# Patient Record
Sex: Female | Born: 1975 | Race: Black or African American | Hispanic: No | Marital: Single | Smoking: Never smoker
Health system: Southern US, Community
[De-identification: ages and names within clinical notes are randomized; demographics above are authoritative.]

## PROBLEM LIST (undated history)

## (undated) DIAGNOSIS — F79 Unspecified intellectual disabilities: Secondary | ICD-10-CM

## (undated) DIAGNOSIS — F209 Schizophrenia, unspecified: Secondary | ICD-10-CM

## (undated) DIAGNOSIS — H9190 Unspecified hearing loss, unspecified ear: Secondary | ICD-10-CM

## (undated) DIAGNOSIS — G809 Cerebral palsy, unspecified: Secondary | ICD-10-CM

## (undated) DIAGNOSIS — E119 Type 2 diabetes mellitus without complications: Secondary | ICD-10-CM

## (undated) DIAGNOSIS — E785 Hyperlipidemia, unspecified: Secondary | ICD-10-CM

## (undated) HISTORY — DX: Schizophrenia, unspecified: F20.9

## (undated) HISTORY — DX: Unspecified intellectual disabilities: F79

## (undated) HISTORY — DX: Unspecified hearing loss, unspecified ear: H91.90

## (undated) HISTORY — DX: Cerebral palsy, unspecified: G80.9

## (undated) HISTORY — DX: Hyperlipidemia, unspecified: E78.5

## (undated) HISTORY — DX: Type 2 diabetes mellitus without complications: E11.9

---

## 2020-01-06 ENCOUNTER — Ambulatory Visit: Payer: Self-pay | Admitting: Family Medicine

## 2020-02-01 ENCOUNTER — Encounter: Payer: Self-pay | Admitting: Family Medicine

## 2020-02-01 ENCOUNTER — Ambulatory Visit (INDEPENDENT_AMBULATORY_CARE_PROVIDER_SITE_OTHER): Payer: Medicaid Other | Admitting: Family Medicine

## 2020-02-01 ENCOUNTER — Other Ambulatory Visit: Payer: Self-pay

## 2020-02-01 VITALS — BP 105/60 | HR 100 | Ht 64.0 in | Wt 157.6 lb

## 2020-02-01 DIAGNOSIS — Z7689 Persons encountering health services in other specified circumstances: Secondary | ICD-10-CM

## 2020-02-01 DIAGNOSIS — Z3009 Encounter for other general counseling and advice on contraception: Secondary | ICD-10-CM | POA: Diagnosis not present

## 2020-02-01 DIAGNOSIS — E1169 Type 2 diabetes mellitus with other specified complication: Secondary | ICD-10-CM

## 2020-02-01 DIAGNOSIS — F209 Schizophrenia, unspecified: Secondary | ICD-10-CM | POA: Diagnosis not present

## 2020-02-01 DIAGNOSIS — H913 Deaf nonspeaking, not elsewhere classified: Secondary | ICD-10-CM | POA: Diagnosis not present

## 2020-02-01 DIAGNOSIS — F79 Unspecified intellectual disabilities: Secondary | ICD-10-CM

## 2020-02-01 DIAGNOSIS — E785 Hyperlipidemia, unspecified: Secondary | ICD-10-CM

## 2020-02-01 DIAGNOSIS — G809 Cerebral palsy, unspecified: Secondary | ICD-10-CM | POA: Diagnosis not present

## 2020-02-01 DIAGNOSIS — E119 Type 2 diabetes mellitus without complications: Secondary | ICD-10-CM

## 2020-02-01 MED ORDER — LISINOPRIL 5 MG PO TABS: 5.0000 mg | ORAL_TABLET | Freq: Every day | ORAL | 0 refills | Status: DC

## 2020-02-01 MED ORDER — EMPAGLIFLOZIN 25 MG PO TABS: 25.0000 mg | ORAL_TABLET | Freq: Every day | ORAL | 0 refills | Status: DC

## 2020-02-01 MED ORDER — GLIPIZIDE 10 MG PO TABS: 10.0000 mg | ORAL_TABLET | Freq: Every day | ORAL | 0 refills | Status: DC

## 2020-02-01 MED ORDER — PRAVASTATIN SODIUM 40 MG PO TABS: 40.0000 mg | ORAL_TABLET | Freq: Every day | ORAL | 0 refills | Status: DC

## 2020-02-01 MED ORDER — SITAGLIPTIN PHOSPHATE 100 MG PO TABS: 100.0000 mg | ORAL_TABLET | Freq: Every day | ORAL | 0 refills | Status: DC

## 2020-02-01 NOTE — Progress Notes (Signed)
SUBJECTIVE:   CHIEF COMPLAINT / HPI:   Establish Care Patient presents today to establish care Caregiver, who is her sister Harle Stanford, present with her today Previously was in [place]  Dr. Chales Salmon was previous doctor, no records in care everywhere  Previously she was living in a [facility] for 9 years, then sister moved back to [place], case worker contacted her sister and said that the place was under investigation for neglect   Sister states that she is concerned that she has been on Depo shot and hasn't had in 3 months, but she isn't sure what her normal periods are like Patient reports last period 6 months ago  Sister is not sure why she is deaf or has intellectual disability  Sister also concerned that she is not on the proper medications  Patient can read and write a little bit, but not at high level She is able to sign her own consent  Unsure when last Pap smear and unsure if they were ever abnormal  Diabetes She has been on Jardiance, Januvia, Glipizide, Lisinopril, Pravastatin Sister checks her sugars in the morning and they are usually in 70s-90s range  Hyperlipidemia  Has been on pravastatin Sister does know that she was diagnosed with high cholesterol  Schizophrenia Sister reports history of diagnosis Currently on Abilify Has been well-controlled to sister's knowledge  Cerebral Palsy Patient walks with scissoring gait Hold right arm to her chest  Sister functions as sign language interpretor throughout visit at patient request  PERTINENT  PMH / PSH: Congenital deaf mutism, cerebral palsy, intellectual disability, diabetes, hyperlipidemia, schizophrenia  OBJECTIVE:   BP 105/60   Pulse 100   Ht 5\' 4"  (1.626 m)   Wt 157 lb 9.6 oz (71.5 kg)   SpO2 98%   BMI 27.05 kg/m    Physical Exam:  General: 44 y.o. female in NAD Cardio: RRR no m/r/g Lungs: CTAB, no wheezing, no rhonchi, no crackles, no IWOB on RA Skin: warm and  dry Extremities: No edema, left arm contracted and held at chest, muscular tone decreased and left upper and lower extremity, scissoring gait Neuro: smiles, follows commands, uses sign language to communicate   ASSESSMENT/PLAN:   Establishing care with new doctor, encounter for New patient packet reviewed.  Reviewed patient's past medical, surgical, family, social history with patient and her sister.  Updated in the computer.  Also updated medications and problem list.  Records release signed to obtain records from Dr. 45.  Once these are obtained, will have patient return in 1 month and at that time can perform labs and work on health maintenance.  Diabetes mellitus without complication (HCC) Currently on Januvia 100 mg, glipizide 10 mg, Jardiance 25 mg.  Refills supplied for 1 month Fridays.  Will request records, then at next visit can obtain A1c and labs.  Sister is helpful to reduce pill burden, would remove glipizide first.  She is also on statin and lisinopril.  Given that her blood pressures are well controlled and on lisinopril 5, she is most likely on this for renal protection.  Hyperlipidemia associated with type 2 diabetes mellitus (HCC) Currently on pravastatin 40 mg daily.  Obtaining records.  Refill supplied for 1 month.  We will see if lipid panel needs ordered at the next visit.  Cerebral palsy [facility]) Sister reports that he is to attend therapy when she lived with their mother, prior to her mother passing and patient moving to most recent living facility.  She  would like for her to get back to therapy as she feels that she has made some declines in her physical abilities.  Referral placed for physical therapy and Occupational Therapy.  Deaf-mutism, congenital Communicates with sign language.  Prefers sister as Equities trader.  Intellectual disability Can follow commands, read and write at a low level, sign her name, answers questions appropriately with sign language  interpreter.  Schizophrenia (HCC) Currently on Abilify 5 mg daily.  Seems to be well controlled.  Discussed with sister that she does not need refills at this time.  After obtaining records, could consider referral to psychiatry.  Encounter for counseling regarding contraception Patient had previously been on Depo shot.  She has not had a period in 6 months per her report.  It is unclear the last time she received a Depo shot.  Discussed with sister that since patient is not sexually active and we are not sure what her previous periods were like, it may be best to wait and see if she has a period, especially since she is approaching age of possible menopause.  Patient does not seem to have a preference.  We will hold off and see how periods occur.     Unknown Jim, DO [facility]

## 2020-02-01 NOTE — Patient Instructions (Signed)
Thank you for coming to see me today. It was a pleasure. Today we talked about:   We will try to get your old records.  We can get labs at your next visit and do a pap smear.  I have sent refills to the pharmacy.   I have placed a referral to Occupational and Physical Therapy.  If you do not hear from them in the next 2 weeks, please give [place] a call.   Please follow-up with me in 1 month.  If you have any questions or concerns, please do not hesitate to call the office at [phone].  Best,   Luis Abed, DO

## 2020-02-02 DIAGNOSIS — F79 Unspecified intellectual disabilities: Secondary | ICD-10-CM | POA: Insufficient documentation

## 2020-02-02 DIAGNOSIS — Z3009 Encounter for other general counseling and advice on contraception: Secondary | ICD-10-CM | POA: Insufficient documentation

## 2020-02-02 DIAGNOSIS — H913 Deaf nonspeaking, not elsewhere classified: Secondary | ICD-10-CM | POA: Insufficient documentation

## 2020-02-02 NOTE — Assessment & Plan Note (Signed)
New patient packet reviewed.  Reviewed patient's past medical, surgical, family, social history with patient and her sister.  Updated in the computer.  Also updated medications and problem list.  Records release signed to obtain records from Dr. Radonna Ricker.  Once these are obtained, will have patient return in 1 month and at that time can perform labs and work on health maintenance.

## 2020-02-02 NOTE — Assessment & Plan Note (Addendum)
Currently on Januvia 100 mg, glipizide 10 mg, Jardiance 25 mg.  Refills supplied for 1 month Fridays.  Will request records, then at next visit can obtain A1c and labs.  Sister is helpful to reduce pill burden, would remove glipizide first.  She is also on statin and lisinopril.  Given that her blood pressures are well controlled and on lisinopril 5, she is most likely on this for renal protection.

## 2020-02-02 NOTE — Assessment & Plan Note (Signed)
Patient had previously been on Depo shot.  She has not had a period in 6 months per her report.  It is unclear the last time she received a Depo shot.  Discussed with sister that since patient is not sexually active and we are not sure what her previous periods were like, it may be best to wait and see if she has a period, especially since she is approaching age of possible menopause.  Patient does not seem to have a preference.  We will hold off and see how periods occur.

## 2020-02-02 NOTE — Assessment & Plan Note (Signed)
Can follow commands, read and write at a low level, sign her name, answers questions appropriately with sign language interpreter.

## 2020-02-02 NOTE — Assessment & Plan Note (Signed)
Sister reports that he is to attend therapy when she lived with their mother, prior to her mother passing and patient moving to most recent living facility.  She would like for her to get back to therapy as she feels that she has made some declines in her physical abilities.  Referral placed for physical therapy and Occupational Therapy.

## 2020-02-02 NOTE — Assessment & Plan Note (Addendum)
Currently on pravastatin 40 mg daily.  Obtaining records.  Refill supplied for 1 month.  We will see if lipid panel needs ordered at the next visit.

## 2020-02-02 NOTE — Assessment & Plan Note (Signed)
Communicates with sign language.  Prefers sister as Equities trader.

## 2020-02-02 NOTE — Assessment & Plan Note (Signed)
Currently on Abilify 5 mg daily.  Seems to be well controlled.  Discussed with sister that she does not need refills at this time.  After obtaining records, could consider referral to psychiatry.

## 2020-03-06 ENCOUNTER — Other Ambulatory Visit: Payer: Self-pay

## 2020-03-06 ENCOUNTER — Encounter: Payer: Self-pay | Admitting: Family Medicine

## 2020-03-06 ENCOUNTER — Ambulatory Visit (INDEPENDENT_AMBULATORY_CARE_PROVIDER_SITE_OTHER): Payer: Medicaid Other | Admitting: Family Medicine

## 2020-03-06 VITALS — BP 132/62 | Ht 64.0 in | Wt 153.0 lb

## 2020-03-06 DIAGNOSIS — E785 Hyperlipidemia, unspecified: Secondary | ICD-10-CM

## 2020-03-06 DIAGNOSIS — E119 Type 2 diabetes mellitus without complications: Secondary | ICD-10-CM

## 2020-03-06 DIAGNOSIS — E1169 Type 2 diabetes mellitus with other specified complication: Secondary | ICD-10-CM | POA: Diagnosis not present

## 2020-03-06 DIAGNOSIS — Z23 Encounter for immunization: Secondary | ICD-10-CM

## 2020-03-06 DIAGNOSIS — Z1159 Encounter for screening for other viral diseases: Secondary | ICD-10-CM

## 2020-03-06 DIAGNOSIS — Z114 Encounter for screening for human immunodeficiency virus [HIV]: Secondary | ICD-10-CM

## 2020-03-06 LAB — POCT GLYCOSYLATED HEMOGLOBIN (HGB A1C): Hemoglobin A1C: 5.7 % — AB (ref 4.0–5.6)

## 2020-03-06 NOTE — Assessment & Plan Note (Addendum)
A1c today very well controlled at 5.7.  Discussed with sister who reports good CBGs at home.  Will discontinue glipizide 10 mg daily and continue to check CBGs.  If continues to be well controlled, would discontinue Januvia before discontinuing Jardiance.  Referral placed for her ophthalmology for dilated eye exam.  She is on lisinopril and pravastatin as well, which we will continue.  BMP obtained today.  Follow-up in 3 months for repeat A1c.

## 2020-03-06 NOTE — Patient Instructions (Signed)
Thank you for coming to see me today. It was a pleasure. Today we talked about:   Stop taking glipizide.    We will get some labs today.  If they are abnormal or we need to do something about them, I will call you.  If they are normal, I will send you a message on MyChart (if it is active) or a letter in the mail.  If you don't hear from [place] in 2 weeks, please call the office at the number below.  Please follow-up with me in 1-2 months.  If you have any questions or concerns, please do not hesitate to call the office at [phone].  Best,   Luis Abed, DO

## 2020-03-06 NOTE — Progress Notes (Signed)
° ° °  SUBJECTIVE:   CHIEF COMPLAINT / HPI:   T2DM Current regimen: Januvia 100 mg daily, glipizide 10 mg daily, Jardiance 25 mg daily Also on lisinopril 5 mg daily and pravastatin 40 mg daily Records were requested from patient's prior physician at visit on 8/10, but not yet obtained CBGs at home usually 70-125 in the morning Doesn't have an eye doctor yet  HLD Current regimen: Pravastatin 40 mg daily Attempted to obtain records to see when patient's last lipid panel was, have not yet received  Schizophrenia Current regimen: Abilify 5 mg daily Patient had previously been living in a home for hearing impaired and was seen by a physician there Sister reports that mood and behavior have been doing well  Patient presents today with her sister who is also her caregiver and interpreter for ASL.  PERTINENT  PMH / PSH: T2DM, HLD, schizophrenia, cerebral palsy, intellectual disability, congenital deaf mutism  OBJECTIVE:   BP 132/62    Ht 5\' 4"  (1.626 m)    Wt 153 lb (69.4 kg)    LMP 02/21/2020    BMI 26.26 kg/m    Physical Exam:  General: 44 y.o. female in NAD Cardio: RRR no m/r/g Lungs: CTAB, no wheezing, no rhonchi, no crackles, no IWOB on RA Skin: warm and dry Extremities: No edema  Results for orders placed or performed in visit on 03/06/20 (from the past 24 hour(s))  HgB A1c     Status: Abnormal   Collection Time: 03/06/20  4:04 PM  Result Value Ref Range   Hemoglobin A1C 5.7 (A) 4.0 - 5.6 %   HbA1c POC (<> result, manual entry)     HbA1c, POC (prediabetic range)     HbA1c, POC (controlled diabetic range)        ASSESSMENT/PLAN:   Diabetes mellitus without complication (HCC) A1c today very well controlled at 5.7.  Discussed with sister who reports good CBGs at home.  Will discontinue glipizide 10 mg daily and continue to check CBGs.  If continues to be well controlled, would discontinue Januvia before discontinuing Jardiance.  Referral placed for her ophthalmology for  dilated eye exam.  She is on lisinopril and pravastatin as well, which we will continue.  BMP obtained today.  Follow-up in 3 months for repeat A1c.   Hyperlipidemia associated with type 2 diabetes mellitus (HCC) Currently on pravastatin 40 mg daily.  Will obtain lipid panel today and see if any further changes need to be made.  LDL goal less than 70.   Hepatitis C antibody and HIV obtained for screening purposes.  Patient records were again requested from previous PCP.  Can have that exam at next visit, also plan to do Pap smear next visit 1-2 months.  03/08/20, DO [facility]

## 2020-03-06 NOTE — Assessment & Plan Note (Signed)
Currently on pravastatin 40 mg daily.  Will obtain lipid panel today and see if any further changes need to be made.  LDL goal less than 70.

## 2020-03-07 ENCOUNTER — Encounter: Payer: Self-pay | Admitting: Family Medicine

## 2020-03-08 LAB — LIPID PANEL
Chol/HDL Ratio: 2.9 ratio (ref 0.0–4.4)
Cholesterol, Total: 137 mg/dL (ref 100–199)
HDL: 47 mg/dL (ref >39)
LDL Chol Calc (NIH): 67 mg/dL (ref 0–99)
Triglycerides: 129 mg/dL (ref 0–149)
VLDL Cholesterol Cal: 23 mg/dL (ref 5–40)

## 2020-03-08 LAB — BASIC METABOLIC PANEL
BUN/Creatinine Ratio: 12 (ref 9–23)
BUN: 15 mg/dL (ref 6–24)
CO2: 15 mmol/L — ABNORMAL LOW (ref 20–29)
Calcium: 9.6 mg/dL (ref 8.7–10.2)
Chloride: 106 mmol/L (ref 96–106)
Creatinine, Ser: 1.3 mg/dL — ABNORMAL HIGH (ref 0.57–1.00)
GFR calc Af Amer: 58 mL/min/{1.73_m2} — ABNORMAL LOW (ref >59)
GFR calc non Af Amer: 51 mL/min/{1.73_m2} — ABNORMAL LOW (ref >59)
Glucose: 81 mg/dL (ref 65–99)
Potassium: 4.1 mmol/L (ref 3.5–5.2)
Sodium: 142 mmol/L (ref 134–144)

## 2020-03-08 LAB — HCV AB W REFLEX TO QUANT PCR: HCV Ab: <0.1 s/co ratio (ref 0.0–0.9)

## 2020-03-08 LAB — HIV ANTIBODY (ROUTINE TESTING W REFLEX): HIV Screen 4th Generation wRfx: NONREACTIVE

## 2020-03-08 LAB — HCV INTERPRETATION

## 2020-03-09 ENCOUNTER — Other Ambulatory Visit: Payer: Self-pay | Admitting: *Deleted

## 2020-03-09 DIAGNOSIS — E119 Type 2 diabetes mellitus without complications: Secondary | ICD-10-CM

## 2020-03-09 MED ORDER — EMPAGLIFLOZIN 25 MG PO TABS: 25.0000 mg | ORAL_TABLET | Freq: Every day | ORAL | 3 refills | Status: DC

## 2020-03-13 ENCOUNTER — Telehealth: Payer: Self-pay

## 2020-03-13 NOTE — Telephone Encounter (Signed)
Patient calls nurse line requesting refill on Topiramate 25 mg with directions to take 1 tablet in the morning and 1 tablet in the evening. Medication is not on current med list.   Please advise if refill is appropriate.   Veronda Prude, RN

## 2020-03-13 NOTE — Telephone Encounter (Signed)
Marissa Mcneil,   I am sorry but it looks like there is no indication in this patient's chart that she takes topiramate or has an indication for it.  It looks like she has not been part of our system for very long so it is possible that she just has not been seen by [place] for this issue yet.  I would encourage her to use Tylenol and ibuprofen for now and to schedule a visit with [place] as soon as is convenient so we can discuss her history and get her the most appropriate medicine.  I am covering Dr. Tamela Oddi inbox for now.  Mirian Mo, MD

## 2020-03-14 NOTE — Telephone Encounter (Signed)
Returned phone call to patient's sister and informed of below. Sister will check with previous prescribing doctor for further information regarding reasoning for Topiramate.   Sister also states that she is needing PA for Januvia. Called and spoke with pharmacy, they will fax over paperwork to start PA.   Veronda Prude, RN

## 2020-04-19 ENCOUNTER — Ambulatory Visit (INDEPENDENT_AMBULATORY_CARE_PROVIDER_SITE_OTHER): Payer: Medicaid Other | Admitting: Family Medicine

## 2020-04-19 ENCOUNTER — Other Ambulatory Visit: Payer: Self-pay

## 2020-04-19 ENCOUNTER — Other Ambulatory Visit (HOSPITAL_COMMUNITY)
Admission: RE | Admit: 2020-04-19 | Discharge: 2020-04-19 | Disposition: A | Discharge status: Home / Self Care | Payer: Medicaid Other | Source: Ambulatory Visit | Attending: Family Medicine | Admitting: Family Medicine

## 2020-04-19 ENCOUNTER — Encounter: Payer: Self-pay | Admitting: Family Medicine

## 2020-04-19 VITALS — BP 118/70 | HR 66 | Ht 64.0 in | Wt 151.0 lb

## 2020-04-19 DIAGNOSIS — Z124 Encounter for screening for malignant neoplasm of cervix: Secondary | ICD-10-CM | POA: Insufficient documentation

## 2020-04-19 DIAGNOSIS — F209 Schizophrenia, unspecified: Secondary | ICD-10-CM

## 2020-04-19 DIAGNOSIS — E119 Type 2 diabetes mellitus without complications: Secondary | ICD-10-CM

## 2020-04-19 DIAGNOSIS — Z0289 Encounter for other administrative examinations: Secondary | ICD-10-CM | POA: Insufficient documentation

## 2020-04-19 DIAGNOSIS — E785 Hyperlipidemia, unspecified: Secondary | ICD-10-CM

## 2020-04-19 DIAGNOSIS — E1169 Type 2 diabetes mellitus with other specified complication: Secondary | ICD-10-CM

## 2020-04-19 DIAGNOSIS — Z1231 Encounter for screening mammogram for malignant neoplasm of breast: Secondary | ICD-10-CM | POA: Diagnosis not present

## 2020-04-19 MED ORDER — PRAVASTATIN SODIUM 40 MG PO TABS: 40.0000 mg | ORAL_TABLET | Freq: Every day | ORAL | 1 refills | Status: DC

## 2020-04-19 MED ORDER — ARIPIPRAZOLE 5 MG PO TABS: 5.0000 mg | ORAL_TABLET | Freq: Every day | ORAL | 1 refills | Status: DC

## 2020-04-19 MED ORDER — SITAGLIPTIN PHOSPHATE 100 MG PO TABS: 100.0000 mg | ORAL_TABLET | Freq: Every day | ORAL | 1 refills | Status: DC

## 2020-04-19 MED ORDER — LISINOPRIL 5 MG PO TABS: 5.0000 mg | ORAL_TABLET | Freq: Every day | ORAL | 1 refills | Status: DC

## 2020-04-19 NOTE — Assessment & Plan Note (Addendum)
Refill of lisinopril, Januvia and Jardiance provided.

## 2020-04-19 NOTE — Assessment & Plan Note (Signed)
FMLA form completed for patient sister.  Advised that she will need regular visits every 3 to 6 months with PCP for diabetes management.  Also advised that she will need occupational therapy and physical therapy regularly given her history of cerebral palsy.

## 2020-04-19 NOTE — Assessment & Plan Note (Signed)
Pap smear performed today.  If negative with negative HPV, good for 5 years.  Declined STD testing today.  She is not sexually active, therefore this is acceptable.

## 2020-04-19 NOTE — Progress Notes (Signed)
    SUBJECTIVE:   CHIEF COMPLAINT / HPI:   Pap Smear Unsure when last Pap smear was in unsure if she has ever had an abnormal Pap smear Patient's last menstrual period was 03/29/2020. Contraception: None at present, was previously on Depo-Provera Sexually Active: No Desire for STD Screening: No Last mammogram: Never  Concerns: None  FMLA paperwork Patient sister presents today with FMLA paperwork that she needs filled out to state that her sister needs frequent medical visits so that she is able to take time off for these  Refills Patient has been having a difficult time getting her refills from the pharmacy She needs refills on all of her medications.  Patient sister was ASL interpreter throughout entirety of visit at patient request  PERTINENT  PMH / PSH: T2DM, HLD, cerebral palsy, deaf/mutism, schizophrenia, intellectual disability  OBJECTIVE:   BP 118/70   Pulse 66   Ht 5\' 4"  (1.626 m)   Wt 151 lb (68.5 kg)   LMP 03/29/2020 Comment: spotting  SpO2 99%   BMI 25.92 kg/m    Physical Exam:  General: 44 y.o. female in NAD Lungs: Breathing comfortably on room air Skin: warm and dry Extremities: No edema GU: Pelvic exam performed with patient supine.  Chaperone in room.  Bilateral labia without abnormalities, no inguinal LAD palpated.  Cervix exhibits some bloody discharge, no cervix abnormalities.  No vaginal lesions.  Vaginal discharge scant, white/yellow.   ASSESSMENT/PLAN:   Screening for malignant neoplasm of cervix Pap smear performed today.  If negative with negative HPV, good for 5 years.  Declined STD testing today.  She is not sexually active, therefore this is acceptable.  Diabetes mellitus without complication (HCC)  Refill of lisinopril, Januvia and Jardiance provided.  Hyperlipidemia associated with type 2 diabetes mellitus (HCC) Refill of pravastatin provided.  Schizophrenia (HCC) Refill of Abilify provided.  Patient has been well controlled on  this medication.  Encounter for completion of form with patient FMLA form completed for patient sister.  Advised that she will need regular visits every 3 to 6 months with PCP for diabetes management.  Also advised that she will need occupational therapy and physical therapy regularly given her history of cerebral palsy.     47, DO [facility]

## 2020-04-19 NOTE — Assessment & Plan Note (Signed)
Refill of pravastatin provided.

## 2020-04-19 NOTE — Assessment & Plan Note (Signed)
Refill of Abilify provided.  Patient has been well controlled on this medication.

## 2020-04-19 NOTE — Patient Instructions (Addendum)
Thank you for coming to see me today. It was a pleasure. Today we talked about:   We performed a Pap smear today.  We will send you a letter with the results unless we need to do something, then we will call you.  I have placed an order for your mammogram.  Please call  Imaging at [phone] to schedule your appointment within one week.  Call [phone] to schedule therapy.  Please follow-up with me in 2 months for your diabetes.  If you have any questions or concerns, please do not hesitate to call the office at [phone].  Best,   Luis Abed, DO

## 2020-04-20 ENCOUNTER — Encounter: Payer: Self-pay | Admitting: Family Medicine

## 2020-04-20 LAB — CYTOLOGY - PAP
Comment: NEGATIVE
Diagnosis: NEGATIVE
High risk HPV: NEGATIVE

## 2020-04-20 NOTE — Progress Notes (Signed)
Letter for pap

## 2020-04-26 ENCOUNTER — Telehealth: Payer: Self-pay | Admitting: Family Medicine

## 2020-04-26 ENCOUNTER — Telehealth: Payer: Self-pay

## 2020-04-26 ENCOUNTER — Other Ambulatory Visit: Payer: Self-pay | Admitting: Family Medicine

## 2020-04-26 DIAGNOSIS — E119 Type 2 diabetes mellitus without complications: Secondary | ICD-10-CM

## 2020-04-26 MED ORDER — ONGLYZA 2.5 MG PO TABS: 2.5000 mg | ORAL_TABLET | Freq: Every day | ORAL | 3 refills | Status: DC

## 2020-04-26 NOTE — Telephone Encounter (Signed)
Called Marissa Mcneil and advised that [place] had to be changed to onglyza due to insurance.  She has been off [place] for some time.  Given low A1c, will start with 2.5mg  and can increase to 5mg  if needed.  She will call back to make an appointment for beginning of January.

## 2020-04-26 NOTE — Progress Notes (Signed)
Change from [place] to [place] due to insurance.

## 2020-04-26 NOTE — Telephone Encounter (Signed)
Changed to onglyza.  See other phone note.

## 2020-04-26 NOTE — Telephone Encounter (Signed)
Insurance will not cover Januvia. Please see below for alternatives.

## 2020-05-08 ENCOUNTER — Ambulatory Visit: Payer: Medicaid Other

## 2020-05-08 ENCOUNTER — Other Ambulatory Visit: Payer: Self-pay

## 2020-05-08 ENCOUNTER — Ambulatory Visit: Payer: Medicaid Other | Attending: Family Medicine | Admitting: Occupational Therapy

## 2020-05-08 DIAGNOSIS — R2681 Unsteadiness on feet: Secondary | ICD-10-CM | POA: Diagnosis present

## 2020-05-08 DIAGNOSIS — R41844 Frontal lobe and executive function deficit: Secondary | ICD-10-CM | POA: Diagnosis present

## 2020-05-08 DIAGNOSIS — M6281 Muscle weakness (generalized): Secondary | ICD-10-CM | POA: Insufficient documentation

## 2020-05-08 DIAGNOSIS — R41840 Attention and concentration deficit: Secondary | ICD-10-CM | POA: Insufficient documentation

## 2020-05-08 DIAGNOSIS — R278 Other lack of coordination: Secondary | ICD-10-CM | POA: Insufficient documentation

## 2020-05-08 DIAGNOSIS — G802 Spastic hemiplegic cerebral palsy: Secondary | ICD-10-CM | POA: Diagnosis not present

## 2020-05-08 DIAGNOSIS — R2689 Other abnormalities of gait and mobility: Secondary | ICD-10-CM | POA: Insufficient documentation

## 2020-05-08 NOTE — Therapy (Signed)
[facility] [address] [place], [place], [address] Phone: [phone]   Fax:  [phone]  Occupational Therapy Evaluation  Patient Details  Name: Marissa Mcneil MRN: [id number] Date of Birth: August 19, 1975 Referring Provider (OT): McDiarmid, Leighton Roach, MD   Encounter Date: 05/08/2020   OT End of Session - 05/08/20 1607    Visit Number 1    Number of Visits 9    Date for OT Re-Evaluation 07/03/20    Authorization Type Medicaid    OT Start Time 1530    OT Stop Time 1615    OT Time Calculation (min) 45 min           Past Medical History:  Diagnosis Date  . Cerebral palsy (HCC)   . Deaf   . Diabetes (HCC)   . Hyperlipidemia   . Intellectual disability   . Schizophrenia (HCC)     No past surgical history on file.  There were no vitals filed for this visit.   Subjective Assessment - 05/08/20 1537    Subjective  Pt is a 44 year old female presenting to neuro OPOT. Pt has diagnosis of Cerebral Palsy and presents with deficts in LUE. Pt's primary caregiver/older sister, was present for the evaluation.    Patient is accompanied by: Family member   sister, Harle Stanford   Pertinent History PMH Cerebral Palsy, Hard of Hearing/ Deaf, nonverbal    Limitations non verbal, Deaf, reads lips, fall risk    Patient Stated Goals want her to be able to do the things cause she tries    Currently in Pain? No/denies             [facility] OT Assessment - 05/08/20 1538      Assessment   Medical Diagnosis Cerebral Palsy    Referring Provider (OT) McDiarmid, Leighton Roach, MD    Hand Dominance Right      Precautions   Precautions Fall      Balance Screen   Has the patient fallen in the past 6 months No      Home  Environment   Family/patient expects to be discharged to: Private residence    Living Arrangements Other relatives    Available Help at Discharge Family      Prior Function   Level of Independence Other (comment)   never  been independent   Vocation Other (comment)    Vocation Requirements attends day program    Leisure crossword puzzles, coloring books, etc      ADL   Eating/Feeding Needs assist with cutting food    Grooming Minimal assistance   assistance for doing hair   Upper Body Bathing Modified independent    Lower Body Bathing Modified independent    Upper Body Dressing Needs assist for fasteners    Lower Body Dressing Needs assist for fasteners    Toilet Transfer Modified independent    Toileting - Clothing Manipulation Minimal assistance    Toileting -  Hygiene Modified Independent    Tub/Shower Transfer Modified independent    ADL comments Pt requires assistance for zippers, tying shoes and doing hair. Pt's sister also reports assistance requireing for pulling pants up on left side in the back      IADL   Prior Level of Function Shopping Dependent    Shopping Completely unable to shop    Prior Level of Function Light Housekeeping Dependent    Light Housekeeping All laundry must be done by others    Prior Level  of Function Meal Prep Dependent    Meal Prep Able to complete simple cold meal and snack prep    Prior Level of Function Community Mobility Dependent    Community Mobility Relies on family or friends for transportation    Prior Level of Function Medication Managment Dependent    Medication Management Is not capable of dispensing or managing own medication    Prior Level of Function Financial Management Dependent    Financial Management Dependent      Mobility   Mobility Status Independent    Mobility Status Comments gets tired easily      Written Expression   Dominant Hand Right      Vision - History   Baseline Vision No visual deficits      Vision Assessment   Depth Perception continue to assess. pt's sister reports increased deficits with descending curbs and stairs      Activity Tolerance   Activity Tolerance Tolerates 10-20 min activity with multiple rests     Activity Tolerance Comments pt's sister reports she gets tired in less than 10 minutes and needs to lean on something or sit      Cognition   Overall Cognitive Status History of cognitive impairments - at baseline    Cognition Comments Pt is deaf and nonverbal. Pt sister reports intellectual disability      Observation/Other Assessments   Focus on Therapeutic Outcomes (FOTO)  NA      Sensation   Light Touch Appears Intact      ROM / Strength   AROM / PROM / Strength AROM;Strength      AROM   Overall AROM  Deficits    Overall AROM Comments RUE WFL    AROM Assessment Site Shoulder;Elbow;Wrist    Right/Left Shoulder Left    Left Shoulder Flexion 130 Degrees    Right/Left Elbow Left    Left Elbow Flexion 80    Left Elbow Extension -55    Right/Left Wrist Left    Left Wrist Extension -60 Degrees    Left Wrist Flexion 60 Degrees      Strength   Overall Strength Deficits    Overall Strength Comments RUE WFL      Left Hand AROM   L Index PIP 0-100 -30 Degrees    L Long PIP 0-100 -35 Degrees    L Ring PIP 0-100 -45 Degrees    L Little PIP 0-100 -15 Degrees      Hand Function   Right Hand Gross Grasp Functional    Right Hand Grip (lbs) 31.7    Left Hand Gross Grasp Impaired    Left Hand Grip (lbs) 8.3    Comment posturing with LUE hand. swan neck deformities noted in all digits (not thumb)                             OT Short Term Goals - 05/08/20 1813      OT SHORT TERM GOAL #1   Title Pt and caregiver will be independent with HEP 06/05/20    Baseline not issued yet    Time 4    Period Weeks    Status New    Target Date 06/05/20      OT SHORT TERM GOAL #2   Title Pt will demonstrate functional use of LUE in every day activities at least 15% of the day    Baseline <10%    Time 4  Period Weeks    Status New      OT SHORT TERM GOAL #3   Title Pt will demonstrate ability to pull up pants all the way with use of LUE (non dominant hand) as  assisting hand with supervision    Baseline min A for pulling up pants    Time 4    Period Weeks    Status New      OT SHORT TERM GOAL #4   Title Pt will demonstrate assisting with at least 3 home management tasks per week (laundry, dishes, cleaning)    Baseline assist w cleaning periodically    Time 4    Period Weeks    Status New             OT Long Term Goals - 05/08/20 1819      OT LONG TERM GOAL #1   Title Pt and caregiver will demontrate updated HEP with independence 07/04/19    Baseline not issued    Time 8    Period Weeks    Status New    Target Date 07/03/20      OT LONG TERM GOAL #2   Title Pt and caregiver will demonstrate understanding of wear and care of any splinting or orthoses for LUE PRN    Baseline none issued    Time 8    Period Weeks    Status New      OT LONG TERM GOAL #3   Title Pt will demonstrate donning socks and shoes including tying with adaptive equipment PRN with supervision    Baseline not currently tying shoes    Time 8    Period Weeks    Status New      OT LONG TERM GOAL #4   Title Pt will demonstrate increased grip strength in LUE by at least 8 lbs for increase in independence with ADLs    Baseline LUE 8.9 (RUE 31.7)    Time 8    Period Weeks    Status New      OT LONG TERM GOAL #5   Title Pt will increase elbow extension to - 45 degrees in LUE for increase in functional use of LUE    Baseline -55    Time 8    Period Weeks    Status New      OT LONG TERM GOAL #6   Title Pt will assist or complete at least 1 home management task per day in order to decrease caregiver burden    Baseline cleans bedroom    Time 8    Period Weeks    Status New      OT LONG TERM GOAL #7   Title Pt will demontrate functional use of LUE in bimanual tasks at least 20% of the day.    Baseline <10%    Time 8    Period Weeks    Status New                 Plan - 05/08/20 1607    Clinical Impression Statement Pt is a 44 year old female  presenting to neuro OPOT with late effects of Cerebral Palsy impeding overall independence with ADLs and IADLs. Pt is accompanied by her sister who is her primary caregiver, Harle Stanford. Pt is deaf and nonverbal. Pt is able to read lips fairly well. Pt has intellectual disability. Pt presents with deficits in range of motion, spasticity, muscle weakness and unsteadiness on feet resulting in decreased independence  ADLs and IADLs. Skilled OT is recommended to target these areas and increase independence and decrease caregiver burden.    OT Occupational Profile and History Problem Focused Assessment - Including review of records relating to presenting problem    Occupational performance deficits (Please refer to evaluation for details): ADL's;IADL's;Play;Social Participation;Leisure    Body Structure / Function / Physical Skills UE functional use;Tone;Pain;GMC;Strength;ADL;Balance;Dexterity;IADL;ROM;Coordination;Flexibility;Mobility;FMC    Cognitive Skills Attention;Memory;Perception;Understand;Thought;Learn;Sequencing;Emotional;Consciousness;Safety Awareness;Problem Solve    Rehab Potential Fair    Clinical Decision Making Limited treatment options, no task modification necessary    Comorbidities Affecting Occupational Performance: None    Modification or Assistance to Complete Evaluation  No modification of tasks or assist necessary to complete eval    OT Frequency 1x / week    OT Duration 8 weeks   or 8 visits over extended time due to scheduling. may discharge early based on progress   OT Treatment/Interventions Self-care/ADL training;Moist Heat;Fluidtherapy;DME and/or AE instruction;Splinting;Balance training;Therapeutic activities;Cognitive remediation/compensation;Therapeutic exercise;Neuromuscular education;Functional Mobility Training;Passive range of motion;Patient/family education;Manual Therapy;Paraffin    Plan LUE ROM HEP    Consulted and Agree with Plan of Care Patient;Family member/caregiver     Family Member Consulted sister and primary caregiver, Harle Stanfordaqeyia           Patient will benefit from skilled therapeutic intervention in order to improve the following deficits and impairments:   Body Structure / Function / Physical Skills: UE functional use, Tone, Pain, GMC, Strength, ADL, Balance, Dexterity, IADL, ROM, Coordination, Flexibility, Mobility, FMC Cognitive Skills: Attention, Memory, Perception, Understand, Thought, Learn, Sequencing, Emotional, Consciousness, Safety Awareness, Problem Solve     Visit Diagnosis: Spastic hemiplegic cerebral palsy (HCC) - Plan: Ot plan of care cert/re-cert  Other lack of coordination - Plan: Ot plan of care cert/re-cert  Muscle weakness (generalized) - Plan: Ot plan of care cert/re-cert  Unsteadiness on feet - Plan: Ot plan of care cert/re-cert  Other abnormalities of gait and mobility - Plan: Ot plan of care cert/re-cert  Attention and concentration deficit - Plan: Ot plan of care cert/re-cert  Frontal lobe and executive function deficit - Plan: Ot plan of care cert/re-cert    Problem List Patient Active Problem List   Diagnosis Date Noted  . Screening for malignant neoplasm of cervix 04/19/2020  . Encounter for completion of form with patient 04/19/2020  . 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 Intellectual disability 02/02/2020  . Encounter for counseling regarding contraception 02/02/2020  . Schizophrenia (HCC) 02/01/2020  . Diabetes mellitus without complication (HCC) 02/01/2020  . Hyperlipidemia associated with type 2 diabetes mellitus (HCC) 02/01/2020  . Cerebral palsy (HCC) 02/01/2020    Junious DresserKirstyn M Sha Burling MOT, OTR/L   05/08/2020, 6:39 PM  [facility] [address] [place], [place], [address] Phone: [phone]   Fax:  [phone]  Name: Oleh GeninSha'Meekia Vazguez MRN: [id number] Date of Birth: 09/26/1975

## 2020-05-23 ENCOUNTER — Ambulatory Visit: Payer: Medicaid Other | Admitting: Occupational Therapy

## 2020-05-26 ENCOUNTER — Ambulatory Visit: Payer: Medicaid Other

## 2020-05-26 ENCOUNTER — Ambulatory Visit: Payer: Medicaid Other | Admitting: Occupational Therapy

## 2020-05-29 ENCOUNTER — Inpatient Hospital Stay: Admission: RE | Admit: 2020-05-29 | Payer: Medicaid Other | Source: Ambulatory Visit

## 2020-06-02 ENCOUNTER — Ambulatory Visit: Payer: Medicaid Other | Admitting: Occupational Therapy

## 2020-06-07 ENCOUNTER — Telehealth: Payer: Self-pay

## 2020-06-07 NOTE — Telephone Encounter (Signed)
Prior approval for Onglyza completed via [place] [employer]. Will check back in 24 hours for determination.

## 2020-06-08 ENCOUNTER — Encounter: Payer: Medicaid Other | Admitting: Occupational Therapy

## 2020-06-09 NOTE — Telephone Encounter (Signed)
Prior approval for Onglyza completed via [place] [employer]. Med approved for 06/07/2020 - 06/07/2021. Pharmacy has been updated.

## 2020-06-15 ENCOUNTER — Encounter: Payer: Medicaid Other | Admitting: Occupational Therapy

## 2020-06-21 ENCOUNTER — Ambulatory Visit: Payer: Medicaid Other | Admitting: Family Medicine

## 2020-06-22 ENCOUNTER — Encounter: Payer: Medicaid Other | Admitting: Occupational Therapy

## 2020-06-27 NOTE — Progress Notes (Deleted)
    SUBJECTIVE:   CHIEF COMPLAINT / HPI:   T2DM Current regimen: Jardiance 25 mg daily, Onglyza 2.5 mg daily, lisinopril 5 mg for renal protection, pravastatin 40 mg daily CBG*** Last A1c 5.7 on 9/13 Last BMP on 9/13, creatinine 1.3 Last lipid panel 9/13, LDL 67 Foot exam***  PERTINENT  PMH / PSH: T2DM, HLD, cerebral palsy, congenital deaf/mutism, schizophrenia  OBJECTIVE:   There were no vitals taken for this visit.  ***  ASSESSMENT/PLAN:   No problem-specific Assessment & Plan notes found for this encounter.     Unknown Jim, DO [facility]

## 2020-06-28 ENCOUNTER — Ambulatory Visit: Payer: Medicaid Other | Admitting: Family Medicine

## 2020-06-29 ENCOUNTER — Encounter: Payer: Medicaid Other | Admitting: Occupational Therapy

## 2020-07-06 ENCOUNTER — Encounter: Payer: Medicaid Other | Admitting: Occupational Therapy

## 2020-07-27 NOTE — Progress Notes (Unsigned)
    SUBJECTIVE:   CHIEF COMPLAINT / HPI:   T2DM Current regimen: jardiance 25mg  QD, onglyza 2.5mg  (changed due to insurance in Nov from Dec) Has been without jardiance 1 week  Glipizide was discontinued on 9/13 given patient's well-controlled A1c and lack of other benefit CBGs: 78 in the AM 2  Last A1c 5.7 on 03/06/2020 Eye exam: none yet Foot exam needed today Last lipid panel with LDL of 67 on 03/06/2020, on pravastatin On lisinopril 5 mg Needs refill on statin  Schizophrenia She has been on Abilify 5 mg daily Sister is requesting prescription refill Does not have a psychiatrist at present  Covid booster, due, had Moderna  PERTINENT  PMH / PSH: T2DM, HLD, cerebral palsy, congenital deaf-mutism, schizophrenia  OBJECTIVE:   BP 112/74   Pulse 88   Wt 152 lb 12.8 oz (69.3 kg)   SpO2 98%   BMI 26.23 kg/m    Physical Exam:  General: 45 y.o. female in NAD Lungs: Breathing comfortably on room air Skin: warm and dry Extremities: No edema  Diabetic Foot Check -  Appearance - no lesions, ulcers or calluses Skin - no unusual pallor or redness Monofilament testing - normal bilaterally  Right - Great toe, medial, central, lateral ball and posterior foot intact Left - Great toe, medial, central, lateral ball and posterior foot intact  Results for orders placed or performed in visit on 07/28/20 (from the past 24 hour(s))  HgB A1c     Status: Abnormal   Collection Time: 07/28/20  4:30 PM  Result Value Ref Range   Hemoglobin A1C 7.4 (A) 4.0 - 5.6 %   HbA1c POC (<> result, manual entry)     HbA1c, POC (prediabetic range)     HbA1c, POC (controlled diabetic range)       ASSESSMENT/PLAN:   Diabetes mellitus without complication (HCC) A1c today 7.4.  We will go ahead and continue with her current regimen.  Continue Jardiance 25 mg daily and Onglyza 2.5 mg daily.  Refill provided for Jardiance that she has been without this for 1 week.  Records still not able to be  obtained from her prior PCP have requested multiple times.  Foot exam performed today.  Advised her to have an eye exam.  She is on both a statin and ACE.  Will obtain a BMP today given change from Januvia to Onglyza.  Hyperlipidemia associated with type 2 diabetes mellitus (HCC) LDL at goal.  Refill provided for pravastatin.  Schizophrenia (HCC) She has been very well controlled, can go ahead and refill Abilify today.  Would consider referral to psychiatry for formal evaluation to see if this can be discontinued in the future.  Can discuss at next visit.   Recommended booster for Covid.  Advised that we do not have Moderna here, but can have Pfizer for her booster.  Sister would like for her to have Moderna and will obtain at the pharmacy.  09/25/20, DO [facility]

## 2020-07-28 ENCOUNTER — Encounter: Payer: Self-pay | Admitting: Family Medicine

## 2020-07-28 ENCOUNTER — Other Ambulatory Visit: Payer: Self-pay

## 2020-07-28 ENCOUNTER — Ambulatory Visit (INDEPENDENT_AMBULATORY_CARE_PROVIDER_SITE_OTHER): Payer: Medicaid Other | Admitting: Family Medicine

## 2020-07-28 VITALS — BP 112/74 | HR 88 | Wt 152.8 lb

## 2020-07-28 DIAGNOSIS — E119 Type 2 diabetes mellitus without complications: Secondary | ICD-10-CM | POA: Diagnosis not present

## 2020-07-28 DIAGNOSIS — E785 Hyperlipidemia, unspecified: Secondary | ICD-10-CM | POA: Diagnosis not present

## 2020-07-28 DIAGNOSIS — E1169 Type 2 diabetes mellitus with other specified complication: Secondary | ICD-10-CM

## 2020-07-28 DIAGNOSIS — F209 Schizophrenia, unspecified: Secondary | ICD-10-CM | POA: Diagnosis not present

## 2020-07-28 LAB — POCT GLYCOSYLATED HEMOGLOBIN (HGB A1C): Hemoglobin A1C: 7.4 % — AB (ref 4.0–5.6)

## 2020-07-28 MED ORDER — ARIPIPRAZOLE 5 MG PO TABS: 5.0000 mg | ORAL_TABLET | Freq: Every day | ORAL | 1 refills | Status: DC

## 2020-07-28 MED ORDER — PRAVASTATIN SODIUM 40 MG PO TABS: 40.0000 mg | ORAL_TABLET | Freq: Every day | ORAL | 1 refills | Status: DC

## 2020-07-28 MED ORDER — EMPAGLIFLOZIN 25 MG PO TABS
25.0000 mg | ORAL_TABLET | Freq: Every day | ORAL | 3 refills | Status: DC
Start: 2020-07-28 — End: 2021-02-20

## 2020-07-28 NOTE — Patient Instructions (Signed)
Thank you for coming to see me today. It was a pleasure. Today we talked about:   We will get some labs today.  If they are abnormal or we need to do something about them, I will call you.  If they are normal, I will send you a message on MyChart (if it is active) or a letter in the mail.  If you don't hear from [place] in 2 weeks, please call the office at the number below.  Continue everything the same.  Please follow-up with me in 3 months.  If you have any questions or concerns, please do not hesitate to call the office at [phone].  Best,   Luis Abed, DO

## 2020-07-28 NOTE — Assessment & Plan Note (Signed)
A1c today 7.4.  We will go ahead and continue with her current regimen.  Continue Jardiance 25 mg daily and Onglyza 2.5 mg daily.  Refill provided for Jardiance that she has been without this for 1 week.  Records still not able to be obtained from her prior PCP have requested multiple times.  Foot exam performed today.  Advised her to have an eye exam.  She is on both a statin and ACE.  Will obtain a BMP today given change from Januvia to Onglyza.

## 2020-07-28 NOTE — Assessment & Plan Note (Signed)
She has been very well controlled, can go ahead and refill Abilify today.  Would consider referral to psychiatry for formal evaluation to see if this can be discontinued in the future.  Can discuss at next visit.

## 2020-07-28 NOTE — Assessment & Plan Note (Signed)
LDL at goal.  Refill provided for pravastatin.

## 2020-07-29 LAB — BASIC METABOLIC PANEL
BUN/Creatinine Ratio: 10 (ref 9–23)
BUN: 10 mg/dL (ref 6–24)
CO2: 23 mmol/L (ref 20–29)
Calcium: 9.5 mg/dL (ref 8.7–10.2)
Chloride: 103 mmol/L (ref 96–106)
Creatinine, Ser: 1.05 mg/dL — ABNORMAL HIGH (ref 0.57–1.00)
GFR calc Af Amer: 76 mL/min/{1.73_m2} (ref >59)
GFR calc non Af Amer: 66 mL/min/{1.73_m2} (ref >59)
Glucose: 125 mg/dL — ABNORMAL HIGH (ref 65–99)
Potassium: 4 mmol/L (ref 3.5–5.2)
Sodium: 138 mmol/L (ref 134–144)

## 2020-10-07 ENCOUNTER — Other Ambulatory Visit: Payer: Self-pay | Admitting: Family Medicine

## 2020-10-07 DIAGNOSIS — E119 Type 2 diabetes mellitus without complications: Secondary | ICD-10-CM

## 2020-10-23 ENCOUNTER — Other Ambulatory Visit: Payer: Self-pay | Admitting: Family Medicine

## 2020-10-23 DIAGNOSIS — Z1231 Encounter for screening mammogram for malignant neoplasm of breast: Secondary | ICD-10-CM

## 2020-11-08 ENCOUNTER — Ambulatory Visit (INDEPENDENT_AMBULATORY_CARE_PROVIDER_SITE_OTHER): Payer: Medicaid Other | Admitting: Family Medicine

## 2020-11-08 ENCOUNTER — Other Ambulatory Visit: Payer: Self-pay

## 2020-11-08 ENCOUNTER — Encounter: Payer: Self-pay | Admitting: Family Medicine

## 2020-11-08 VITALS — BP 108/82 | HR 77 | Ht 64.0 in | Wt 161.0 lb

## 2020-11-08 DIAGNOSIS — G809 Cerebral palsy, unspecified: Secondary | ICD-10-CM | POA: Diagnosis not present

## 2020-11-08 DIAGNOSIS — E119 Type 2 diabetes mellitus without complications: Secondary | ICD-10-CM | POA: Diagnosis not present

## 2020-11-08 LAB — POCT GLYCOSYLATED HEMOGLOBIN (HGB A1C): HbA1c, POC (controlled diabetic range): 8.8 % — AB (ref 0.0–7.0)

## 2020-11-08 MED ORDER — LISINOPRIL 5 MG PO TABS: 5.0000 mg | ORAL_TABLET | Freq: Every day | ORAL | 1 refills | Status: DC

## 2020-11-08 MED ORDER — METFORMIN HCL ER 500 MG PO TB24: 500.0000 mg | ORAL_TABLET | Freq: Two times a day (BID) | ORAL | 3 refills | Status: DC

## 2020-11-08 NOTE — Assessment & Plan Note (Signed)
Per above, patient has been unable to go to in person occupational therapy.  Referral placed to home health occupational therapy, as she is unable to leave the home safely without assistance and her sister and caregiver is unable to take her 3-4 times a week to therapy.  She will continue to require occupational therapy for her spastic hemiplegic cerebral palsy with deficits in her left upper extremity.  Did discuss with her sister that it may be quite some time to get her placed into a home health occupational therapy program given current system overload, which she understands.

## 2020-11-08 NOTE — Assessment & Plan Note (Addendum)
A1c has now increased from 7.4-8.8.  She has been compliant with Jardiance and Onglyza.  Discussed with sister that we are still unclear as to why patient was not on metformin when she presented here, have been unable to obtain records from her previous PCP.  Discussed increasing Jardiance or trialing start of metformin.  Ultimately, decided to go ahead and start with metformin.  If she is able to tolerate this and still needs further improvement on her A1c, can increase Jardiance to maximum dose.  We will start her slowly on metformin 500 mg daily, after 1 week if tolerating well can increase to twice daily.  Plan to have her come back in 1 month we will repeat lab work at that time and can increase to 1000 mg twice daily if tolerated.  Continue on current dose of Jardiance and Onglyza.  Continue lisinopril and pravastatin.  Referral placed again for ophthalmology.

## 2020-11-08 NOTE — Progress Notes (Signed)
SUBJECTIVE:   CHIEF COMPLAINT / HPI:   T2DM Current regimen: Jardiance 25 mg daily, Onglyza 2.5 mg daily She had been without Jardiance for 1 week at last visit, did obtain BMP prior to leaving on 2/4 which was within normal limits, creatinine at 1.05 CBG checked two days ago was 93, checks sporadically Last lipid panel 03/06/2020, on pravastatin, LDL 67 at that time Also on lisinopril 5 mg Hasn't had Eye Exam, never got a call from referral, looks like it was denied due to not accepting Medicaid patients  History provided by sister, who is her caregiver  Results for orders placed or performed in visit on 11/08/20 (from the past 24 hour(s))  POCT glycosylated hemoglobin (Hb A1C)     Status: Abnormal   Collection Time: 11/08/20  2:32 PM  Result Value Ref Range   Hemoglobin A1C     HbA1c POC (<> result, manual entry)     HbA1c, POC (prediabetic range)     HbA1c, POC (controlled diabetic range) 8.8 (A) 0.0 - 7.0 %     Cerebral Palsy, Gait abnormality Has attempted to go to OT, but cannot go 3-4 times a week in person because sister cannot take her Patient is unable to drive herself and cannot leave home safely alone She is also deaf and nonverbal Would appreciate referral for home health as in-person therapy is not feasbale for the patient and her caregiver She has spastic hemiplegic cerebral palsy with deficits in LUE  Mammogram scheduled  PERTINENT  PMH / PSH: T2DM, HLD, cerebral palsy, schizophrenia, intellectual disability  OBJECTIVE:   BP 108/82   Pulse 77   Ht 5\' 4"  (1.626 m)   Wt 161 lb (73 kg)   SpO2 99%   BMI 27.64 kg/m    Physical Exam:  General: 45 y.o. female in NAD, deaf, non-verbal, intermittently speaks with caregiver using sign language Lungs: Breathing comfortably on room air Skin: warm and dry Extremities: No edema, left arm hemiplegia, scissoring gait   ASSESSMENT/PLAN:   Diabetes mellitus without complication (HCC) A1c has now increased  from 7.4-8.8.  She has been compliant with Jardiance and Onglyza.  Discussed with sister that we are still unclear as to why patient was not on metformin when she presented here, have been unable to obtain records from her previous PCP.  Discussed increasing Jardiance or trialing start of metformin.  Ultimately, decided to go ahead and start with metformin.  If she is able to tolerate this and still needs further improvement on her A1c, can increase Jardiance to maximum dose.  We will start her slowly on metformin 500 mg daily, after 1 week if tolerating well can increase to twice daily.  Plan to have her come back in 1 month we will repeat lab work at that time and can increase to 1000 mg twice daily if tolerated.  Continue on current dose of Jardiance and Onglyza.  Continue lisinopril and pravastatin.  Referral placed again for ophthalmology.  Cerebral palsy (HCC) Per above, patient has been unable to go to in person occupational therapy.  Referral placed to home health occupational therapy, as she is unable to leave the home safely without assistance and her sister and caregiver is unable to take her 3-4 times a week to therapy.  She will continue to require occupational therapy for her spastic hemiplegic cerebral palsy with deficits in her left upper extremity.  Did discuss with her sister that it may be quite some time to get  her placed into a home health occupational therapy program given current system overload, which she understands.     Unknown Jim, DO [facility]

## 2020-11-08 NOTE — Patient Instructions (Addendum)
Thank you for coming to see me today. It was a pleasure. Today we talked about:   We will get some labs today.  If they are abnormal or we need to do something about them, I will call you.  If they are normal, I will send you a message on MyChart (if it is active) or a letter in the mail.  If you don't hear from [place] in 2 weeks, please call the office at the number below.  We will start Metformin at 500mg  once a day for a week.  If she is tolerating this well, then you can increase to 500mg  twice a day.  Continue this until you come back on 6/13.  I will try to get her into home health OT.  I have placed a referral to Ophthalmology for an eye exam.  If you do not hear from them in the next 2 weeks, please give a call.  Please follow-up with me on 6/13.  If you have any questions or concerns, please do not hesitate to call the office at [phone].  Best,   7/13, DO

## 2020-11-09 LAB — BASIC METABOLIC PANEL
BUN/Creatinine Ratio: 7 — ABNORMAL LOW (ref 9–23)
BUN: 8 mg/dL (ref 6–24)
CO2: 19 mmol/L — ABNORMAL LOW (ref 20–29)
Calcium: 9.3 mg/dL (ref 8.7–10.2)
Chloride: 101 mmol/L (ref 96–106)
Creatinine, Ser: 1.16 mg/dL — ABNORMAL HIGH (ref 0.57–1.00)
Glucose: 170 mg/dL — ABNORMAL HIGH (ref 65–99)
Potassium: 4.2 mmol/L (ref 3.5–5.2)
Sodium: 140 mmol/L (ref 134–144)
eGFR: 60 mL/min/{1.73_m2} (ref >59)

## 2020-11-10 ENCOUNTER — Other Ambulatory Visit: Payer: Self-pay | Admitting: Family Medicine

## 2020-11-10 DIAGNOSIS — G809 Cerebral palsy, unspecified: Secondary | ICD-10-CM

## 2020-11-10 NOTE — Progress Notes (Signed)
HH order for PT and OT

## 2020-12-04 ENCOUNTER — Other Ambulatory Visit: Payer: Self-pay

## 2020-12-04 ENCOUNTER — Ambulatory Visit (INDEPENDENT_AMBULATORY_CARE_PROVIDER_SITE_OTHER): Payer: Medicaid Other | Admitting: Family Medicine

## 2020-12-04 VITALS — BP 106/74 | HR 95 | Ht 64.0 in | Wt 158.4 lb

## 2020-12-04 DIAGNOSIS — E119 Type 2 diabetes mellitus without complications: Secondary | ICD-10-CM | POA: Diagnosis present

## 2020-12-04 LAB — OPHTHALMOLOGY REPORT-SCANNED

## 2020-12-04 NOTE — Progress Notes (Signed)
    SUBJECTIVE:   CHIEF COMPLAINT / HPI:   T2DM Current regimen: Jardiance 25 mg daily, Onglyza 2.5 mg daily, metformin 500 mg BID, has been up to BID since beginning of last week She did have diarrhea at first, but now resolve CBGs 81 this AM, nothing near the 200s Last BMP on 5/18, WNL Had eye appointment today  History provided by sister, caregiver  PERTINENT  PMH / PSH: T2DM, HLD, cerebral palsy, schizophrenia, intellectual disability  OBJECTIVE:   BP 106/74   Pulse 95   Ht 5\' 4"  (1.626 m)   Wt 158 lb 6.4 oz (71.8 kg)   SpO2 98%   BMI 27.19 kg/m    Physical Exam:  General: 45 y.o. female in NAD Lungs: Breathing comfortably on room air Skin: warm and dry Extremities: No edema  ASSESSMENT/PLAN:   Diabetes mellitus without complication (HCC) CBGs are improving.  She is able to tolerate increase in metformin from 500 daily to twice daily as of last week.  We will go ahead and continue at this dose.  We will have her follow-up in 1 month, bring CBG log, could consider increasing to metformin 1000 mg twice daily at that time if she continues to tolerate well.  BMP obtained today.     57, DO [facility]

## 2020-12-04 NOTE — Assessment & Plan Note (Signed)
CBGs are improving.  She is able to tolerate increase in metformin from 500 daily to twice daily as of last week.  We will go ahead and continue at this dose.  We will have her follow-up in 1 month, bring CBG log, could consider increasing to metformin 1000 mg twice daily at that time if she continues to tolerate well.  BMP obtained today.

## 2020-12-04 NOTE — Patient Instructions (Signed)
Thank you for coming to see me today. It was a pleasure. Today we talked about:   We will get some labs today.  If they are abnormal or we need to do something about them, I will call you.  If they are normal, I will send you a message on MyChart (if it is active) or a letter in the mail.  If you don't hear from [place] in 2 weeks, please call the office at the number below.   Continue Metformin 500mg  twice a day.  Continue other medications as well.  Please follow-up with new PCP in 1 month.  Please bring a sugar log.  If you have any questions or concerns, please do not hesitate to call the office at [phone].  Best,   [phone], DO

## 2020-12-05 LAB — BASIC METABOLIC PANEL
BUN/Creatinine Ratio: 11 (ref 9–23)
BUN: 12 mg/dL (ref 6–24)
CO2: 20 mmol/L (ref 20–29)
Calcium: 9.3 mg/dL (ref 8.7–10.2)
Chloride: 103 mmol/L (ref 96–106)
Creatinine, Ser: 1.08 mg/dL — ABNORMAL HIGH (ref 0.57–1.00)
Glucose: 288 mg/dL — ABNORMAL HIGH (ref 65–99)
Potassium: 4 mmol/L (ref 3.5–5.2)
Sodium: 140 mmol/L (ref 134–144)
eGFR: 65 mL/min/{1.73_m2} (ref >59)

## 2020-12-18 ENCOUNTER — Other Ambulatory Visit: Payer: Self-pay

## 2020-12-18 ENCOUNTER — Ambulatory Visit
Admission: RE | Admit: 2020-12-18 | Discharge: 2020-12-18 | Disposition: A | Discharge status: Home / Self Care | Payer: Medicaid Other | Source: Ambulatory Visit | Attending: Family Medicine | Admitting: Family Medicine

## 2020-12-18 DIAGNOSIS — Z1231 Encounter for screening mammogram for malignant neoplasm of breast: Secondary | ICD-10-CM

## 2020-12-20 ENCOUNTER — Other Ambulatory Visit: Payer: Self-pay | Admitting: Family Medicine

## 2020-12-20 DIAGNOSIS — N6489 Other specified disorders of breast: Secondary | ICD-10-CM

## 2020-12-20 DIAGNOSIS — R928 Other abnormal and inconclusive findings on diagnostic imaging of breast: Secondary | ICD-10-CM

## 2020-12-20 NOTE — Progress Notes (Signed)
Diagnostic mammogram ordered per screening mammo recs

## 2021-01-01 ENCOUNTER — Other Ambulatory Visit: Payer: Self-pay

## 2021-01-01 ENCOUNTER — Ambulatory Visit (INDEPENDENT_AMBULATORY_CARE_PROVIDER_SITE_OTHER): Payer: Medicaid Other | Admitting: Family Medicine

## 2021-01-01 ENCOUNTER — Encounter: Payer: Self-pay | Admitting: Family Medicine

## 2021-01-01 DIAGNOSIS — E119 Type 2 diabetes mellitus without complications: Secondary | ICD-10-CM | POA: Diagnosis present

## 2021-01-01 MED ORDER — METFORMIN HCL ER 500 MG PO TB24: 1000.0000 mg | ORAL_TABLET | Freq: Two times a day (BID) | ORAL | 3 refills | Status: DC

## 2021-01-01 NOTE — Patient Instructions (Signed)
She is doing well with tolerating metformin. We will increase to 1000 mg twice daily. Follow up in 1 month and we will repeat kidney function labs as well as A1c.   Dr. Salvadore Dom

## 2021-01-01 NOTE — Progress Notes (Signed)
    SUBJECTIVE:   CHIEF COMPLAINT / HPI:   Ms. Millspaugh is a 45 yo F who presents with family member/caregiver for follow up on the issue below.   Diabetes Current Regimen: Jardiance 25 mg daily, onglyza 2.5 mg daily, metformin 500 mg BID (recently increased from 500 QD at last visit); increasing metformin due to prior GI side effects from higher doses.  CBGs: Avg. Low 100s, High 170 (1 month prior)  Last A1c: 8.8 on 11/08/2020  Last Eye Exam: Ordered 11/08/2020 Statin: Pravastatin 40 mg daily ACE/ARB: Lisinopril 5 mg daily  PERTINENT  PMH / PSH: CP, Deaf  OBJECTIVE:   BP 101/80   Pulse 73   Ht 5\' 4"  (1.626 m)   Wt 158 lb (71.7 kg)   SpO2 99%   BMI 27.12 kg/m   General: Appears well, no acute distress. Age appropriate. Cardiac: RRR, normal heart sounds, no murmurs Respiratory: CTAB, normal effort Extremities: No edema or cyanosis.  ASSESSMENT/PLAN:   Diabetes mellitus without complication (HCC) Follow up from 5/18 visit. Reviewed Dr. 6/18 note. Did not bring log. Stated CBGs are in good range. Tolerating Metformin increase well, agrees with increasing today. Will continue to check CBGs daily. Follow up in 1 month for repeat A1c and repeat BMP.  - metFORMIN (GLUCOPHAGE-XR) 500 MG 24 hr tablet; Take 2 tablets (1,000 mg total) by mouth 2 (two) times daily.   - Continue other medications as above - F/u 1 month for repeat A1c and BMP     Tamela Oddi, DO [facility]

## 2021-01-02 ENCOUNTER — Other Ambulatory Visit: Payer: Self-pay | Admitting: Family Medicine

## 2021-01-02 DIAGNOSIS — G809 Cerebral palsy, unspecified: Secondary | ICD-10-CM

## 2021-01-02 NOTE — Progress Notes (Signed)
Updated home health PT/OT orders per request

## 2021-01-04 NOTE — Assessment & Plan Note (Signed)
Follow up from 5/18 visit. Reviewed Dr. Tamela Oddi note. Did not bring log. Stated CBGs are in good range. Tolerating Metformin increase well, agrees with increasing today. Will continue to check CBGs daily. Follow up in 1 month for repeat A1c and repeat BMP.  - metFORMIN (GLUCOPHAGE-XR) 500 MG 24 hr tablet; Take 2 tablets (1,000 mg total) by mouth 2 (two) times daily.   - Continue other medications as above - F/u 1 month for repeat A1c and BMP

## 2021-01-09 ENCOUNTER — Other Ambulatory Visit: Payer: Self-pay

## 2021-01-09 ENCOUNTER — Ambulatory Visit
Admission: RE | Admit: 2021-01-09 | Discharge: 2021-01-09 | Disposition: A | Discharge status: Home / Self Care | Payer: Medicaid Other | Source: Ambulatory Visit | Attending: Family Medicine | Admitting: Family Medicine

## 2021-01-09 ENCOUNTER — Ambulatory Visit: Payer: Medicaid Other

## 2021-01-09 DIAGNOSIS — R928 Other abnormal and inconclusive findings on diagnostic imaging of breast: Secondary | ICD-10-CM

## 2021-02-02 ENCOUNTER — Ambulatory Visit: Payer: Medicaid Other | Admitting: Family Medicine

## 2021-02-20 ENCOUNTER — Encounter: Payer: Self-pay | Admitting: Family Medicine

## 2021-02-20 ENCOUNTER — Other Ambulatory Visit: Payer: Self-pay

## 2021-02-20 ENCOUNTER — Ambulatory Visit (INDEPENDENT_AMBULATORY_CARE_PROVIDER_SITE_OTHER): Payer: Medicaid Other | Admitting: Family Medicine

## 2021-02-20 ENCOUNTER — Ambulatory Visit (INDEPENDENT_AMBULATORY_CARE_PROVIDER_SITE_OTHER): Payer: Medicaid Other

## 2021-02-20 VITALS — BP 115/80 | HR 86 | Ht 64.0 in | Wt 156.6 lb

## 2021-02-20 DIAGNOSIS — E1169 Type 2 diabetes mellitus with other specified complication: Secondary | ICD-10-CM | POA: Diagnosis not present

## 2021-02-20 DIAGNOSIS — E785 Hyperlipidemia, unspecified: Secondary | ICD-10-CM

## 2021-02-20 DIAGNOSIS — Z23 Encounter for immunization: Secondary | ICD-10-CM

## 2021-02-20 DIAGNOSIS — F209 Schizophrenia, unspecified: Secondary | ICD-10-CM

## 2021-02-20 DIAGNOSIS — E119 Type 2 diabetes mellitus without complications: Secondary | ICD-10-CM | POA: Diagnosis not present

## 2021-02-20 LAB — POCT GLYCOSYLATED HEMOGLOBIN (HGB A1C): HbA1c, POC (controlled diabetic range): 7.3 % — AB (ref 0.0–7.0)

## 2021-02-20 MED ORDER — LISINOPRIL 5 MG PO TABS: 5.0000 mg | ORAL_TABLET | Freq: Every day | ORAL | 1 refills | Status: DC

## 2021-02-20 MED ORDER — PRAVASTATIN SODIUM 40 MG PO TABS: 40.0000 mg | ORAL_TABLET | Freq: Every day | ORAL | 1 refills | Status: DC

## 2021-02-20 MED ORDER — ONGLYZA 2.5 MG PO TABS: 2.5000 mg | ORAL_TABLET | Freq: Every day | ORAL | 1 refills | Status: DC

## 2021-02-20 MED ORDER — EMPAGLIFLOZIN 25 MG PO TABS: 25.0000 mg | ORAL_TABLET | Freq: Every day | ORAL | 1 refills | Status: DC

## 2021-02-20 MED ORDER — ARIPIPRAZOLE 5 MG PO TABS: 5.0000 mg | ORAL_TABLET | Freq: Every day | ORAL | 1 refills | Status: DC

## 2021-02-20 NOTE — Assessment & Plan Note (Signed)
Well-controlled. A1c 7.3% today, which is improved from prior (8.8%). BMP stable 2 months ago. -Continue Metformin 100mg  BID -Continue Onglyza 2.5mg  daily and Jardiance 25mg  daily -Already on statin and ACEi, UTD on foot exam and eye exam -Given PCV and COVID booster today -F/u in 3 months, recheck A1c and BMP at that time

## 2021-02-20 NOTE — Patient Instructions (Addendum)
It was great to see you!  -Your A1c was 7.3% today, which is good news. In May it was 8.8%. Your goal A1c is 7.0% or below.  -Continue taking all your medications as prescribed (no changes today) -I have sent refills on your medications to your pharmacy -Today we did your pneumonia vaccine and your COVID booster. You may experience fever, body aches, headache, and fatigue over the next 24-36 hours. It should not last longer than this. You can take Tylenol every 6 hours as needed.  We will see you again in 3 months unless you need anything sooner!  Take care,  Dr. [facility]

## 2021-02-20 NOTE — Progress Notes (Signed)
    SUBJECTIVE:   CHIEF COMPLAINT / HPI:   Type 2 Diabetes Patient here to follow up on her diabetes.  Current medications: Metformin 1000mg  twice daily (was increased from 500mg  BID at last visit 1 month ago), Jardiance 25mg  daily, and Onglyza 2.5mg  daily. Patient doing well-- no side effects from increased dose of Metformin. Checks her fasting sugars once daily at home. Sister reports they range between 80-110. No low readings over past few months. Highest reading 154 but that was 2 months ago. She pays attention to her diet and limits sweets. Sister reports she had an eye exam in May  Med Refills Needs refills on all meds except Metformin  PERTINENT  PMH / PSH: Cerebral palsy, congenital deaf-mutism, schizophrenia  OBJECTIVE:   BP 115/80   Pulse 86   Ht 5\' 4"  (1.626 m)   Wt 156 lb 9.6 oz (71 kg)   LMP  (LMP Unknown)   SpO2 99%   BMI 26.88 kg/m   General: alert, NAD Cardiac: RRR, S1 S2 present. normal heart sounds, no murmurs. Respiratory: CTAB, normal effort, No wheezes, rales or rhonchi Skin: warm and dry, no rashes noted Neuro: alert  ASSESSMENT/PLAN:   Diabetes mellitus without complication (HCC) Well-controlled. A1c 7.3% today, which is improved from prior (8.8%). BMP stable 2 months ago. -Continue Metformin 100mg  BID -Continue Onglyza 2.5mg  daily and Jardiance 25mg  daily -Already on statin and ACEi, UTD on foot exam and eye exam -Given PCV and COVID booster today -F/u in 3 months, recheck A1c and BMP at that time   Medication Refills Refills sent for her Jardiance, Onglyza, Pravastatin, Lisinopril, and Abilify   , MD [facility]

## 2021-03-22 ENCOUNTER — Telehealth: Payer: Self-pay

## 2021-03-22 NOTE — Telephone Encounter (Signed)
Clinical info completed on Home Health form.  Place form in Dr Cyndia Skeeters box for completion.  Sunday Spillers, CMA

## 2021-08-14 ENCOUNTER — Other Ambulatory Visit: Payer: Self-pay | Admitting: Family Medicine

## 2021-08-14 DIAGNOSIS — E119 Type 2 diabetes mellitus without complications: Secondary | ICD-10-CM

## 2021-08-14 DIAGNOSIS — E1169 Type 2 diabetes mellitus with other specified complication: Secondary | ICD-10-CM

## 2021-08-14 DIAGNOSIS — E785 Hyperlipidemia, unspecified: Secondary | ICD-10-CM

## 2021-08-16 ENCOUNTER — Ambulatory Visit: Payer: Medicaid Other | Admitting: Student

## 2021-08-26 NOTE — Progress Notes (Signed)
? ? ?  SUBJECTIVE:  ? ?CHIEF COMPLAINT / HPI:  ? ?Legal guardian present today.  ? ?Diabetes ?Current Regimen:  Metformin 1000mg  twice daily, Jardiance 25mg  daily, and Onglyza 2.5mg  daily. ?CBGs: 86 is lowest in the last 2 weeks  ?Last A1c: 7.3 on 02/20/21  ?Denies polyuria, polydipsia, hypoglycemia ?Last Eye Exam: Needs ?Statin: Pravastatin 40 mg daily ?ACE/ARB: Lisinopril 5 mg daily ? ?PERTINENT  PMH / PSH: Congenital Deaf-mutism ? ?OBJECTIVE:  ? ?BP 103/63   Pulse 88   Ht 5\' 4"  (1.626 m)   Wt 141 lb 12.8 oz (64.3 kg)   LMP 08/12/2021 (Exact Date)   SpO2 100%   BMI 24.34 kg/m?   ?Results for orders placed or performed in visit on 08/27/21 (from the past 24 hour(s))  ?HgB A1c     Status: None  ? Collection Time: 08/27/21  4:08 PM  ?Result Value Ref Range  ? Hemoglobin A1C    ? HbA1c POC (<> result, manual entry)    ? HbA1c, POC (prediabetic range)    ? HbA1c, POC (controlled diabetic range) 6.0 0.0 - 7.0 %  ? ? ?Physical Exam ?Vitals reviewed.  ?Constitutional:   ?   General: She is not in acute distress. ?   Appearance: She is not ill-appearing, toxic-appearing or diaphoretic.  ?Cardiovascular:  ?   Rate and Rhythm: Normal rate and regular rhythm.  ?   Heart sounds: Normal heart sounds.  ?Pulmonary:  ?   Effort: Pulmonary effort is normal.  ?   Breath sounds: Normal breath sounds.  ?Neurological:  ?   Mental Status: She is alert. Mental status is at baseline.  ?Psychiatric:     ?   Mood and Affect: Mood normal.     ?   Behavior: Behavior normal.  ? ?ASSESSMENT/PLAN:  ? ?Diabetes mellitus without complication ([place]) ?Improved 2/2 intentional weight loss and improved diet.  ?- discontinue saxagliptin ?- F/u in 3 month, recheck A1c, consider decreasing metformin ?- Needs Tdap at follow up ? ?Marissa Lingle Autry-Lott, DO ?[place]  ?

## 2021-08-27 ENCOUNTER — Other Ambulatory Visit: Payer: Self-pay

## 2021-08-27 ENCOUNTER — Encounter: Payer: Self-pay | Admitting: Family Medicine

## 2021-08-27 ENCOUNTER — Ambulatory Visit (INDEPENDENT_AMBULATORY_CARE_PROVIDER_SITE_OTHER): Payer: Medicaid Other | Admitting: Family Medicine

## 2021-08-27 VITALS — BP 103/63 | HR 88 | Ht 64.0 in | Wt 141.8 lb

## 2021-08-27 DIAGNOSIS — E119 Type 2 diabetes mellitus without complications: Secondary | ICD-10-CM

## 2021-08-27 LAB — POCT GLYCOSYLATED HEMOGLOBIN (HGB A1C): HbA1c, POC (controlled diabetic range): 6 % (ref 0.0–7.0)

## 2021-08-27 MED ORDER — METFORMIN HCL ER 500 MG PO TB24: 1000.0000 mg | ORAL_TABLET | Freq: Every day | ORAL | 0 refills | Status: DC

## 2021-08-27 MED ORDER — METFORMIN HCL ER 500 MG PO TB24: 1000.0000 mg | ORAL_TABLET | Freq: Two times a day (BID) | ORAL | 0 refills | Status: DC

## 2021-08-27 NOTE — Patient Instructions (Addendum)
It was wonderful to see you today. ? ?Please bring ALL of your medications with you to every visit.  ? ?Today we talked about: ? ?Your A1c has improved with your weight loss. Good job, you can stop the saxagliptin. Follow up in 3 months.  ? ?Please be sure to schedule follow up at the front  desk before you leave today.  ? ?If you haven't already, sign up for My Chart to have easy access to your labs results, and communication with your primary care physician. ? ?Please call the clinic at [phone] if your symptoms worsen or you have any concerns. It was our pleasure to serve you. ? ?Dr. Salvadore Dom ? ?

## 2021-09-23 ENCOUNTER — Other Ambulatory Visit: Payer: Self-pay | Admitting: Family Medicine

## 2021-09-23 DIAGNOSIS — E119 Type 2 diabetes mellitus without complications: Secondary | ICD-10-CM

## 2021-10-08 ENCOUNTER — Other Ambulatory Visit: Payer: Self-pay | Admitting: Family Medicine

## 2021-10-08 DIAGNOSIS — E119 Type 2 diabetes mellitus without complications: Secondary | ICD-10-CM

## 2021-10-27 ENCOUNTER — Other Ambulatory Visit: Payer: Self-pay | Admitting: Family Medicine

## 2021-10-27 DIAGNOSIS — E119 Type 2 diabetes mellitus without complications: Secondary | ICD-10-CM

## 2021-10-29 MED ORDER — METFORMIN HCL ER 500 MG PO TB24: 1000.0000 mg | ORAL_TABLET | Freq: Two times a day (BID) | ORAL | 0 refills | Status: DC

## 2021-10-29 NOTE — Telephone Encounter (Signed)
Refused Rx for Onglyza as this was discontinued at her last visit. ?

## 2021-10-29 NOTE — Addendum Note (Signed)
Addended by: Maury Dus on: 10/29/2021 09:36 AM ? ? Modules accepted: Orders ? ?

## 2021-11-24 ENCOUNTER — Other Ambulatory Visit: Payer: Self-pay | Admitting: Family Medicine

## 2021-11-24 DIAGNOSIS — E119 Type 2 diabetes mellitus without complications: Secondary | ICD-10-CM

## 2021-12-29 ENCOUNTER — Other Ambulatory Visit: Payer: Self-pay | Admitting: Family Medicine

## 2021-12-29 DIAGNOSIS — E119 Type 2 diabetes mellitus without complications: Secondary | ICD-10-CM

## 2021-12-29 DIAGNOSIS — F209 Schizophrenia, unspecified: Secondary | ICD-10-CM

## 2021-12-29 DIAGNOSIS — E1169 Type 2 diabetes mellitus with other specified complication: Secondary | ICD-10-CM

## 2022-02-02 ENCOUNTER — Other Ambulatory Visit: Payer: Self-pay | Admitting: Family Medicine

## 2022-02-02 DIAGNOSIS — E119 Type 2 diabetes mellitus without complications: Secondary | ICD-10-CM

## 2022-03-09 ENCOUNTER — Other Ambulatory Visit: Payer: Self-pay | Admitting: Family Medicine

## 2022-03-09 DIAGNOSIS — E119 Type 2 diabetes mellitus without complications: Secondary | ICD-10-CM

## 2022-03-14 ENCOUNTER — Other Ambulatory Visit: Payer: Self-pay | Admitting: Family Medicine

## 2022-03-14 DIAGNOSIS — E119 Type 2 diabetes mellitus without complications: Secondary | ICD-10-CM

## 2022-03-15 NOTE — Telephone Encounter (Signed)
Refused rx request for Metformin- refill already sent 4 days ago.

## 2022-03-19 ENCOUNTER — Other Ambulatory Visit: Payer: Self-pay | Admitting: Family Medicine

## 2022-03-19 DIAGNOSIS — E119 Type 2 diabetes mellitus without complications: Secondary | ICD-10-CM

## 2022-04-24 ENCOUNTER — Other Ambulatory Visit: Payer: Self-pay | Admitting: Family Medicine

## 2022-04-24 DIAGNOSIS — E119 Type 2 diabetes mellitus without complications: Secondary | ICD-10-CM

## 2022-04-26 ENCOUNTER — Other Ambulatory Visit: Payer: Self-pay

## 2022-04-26 DIAGNOSIS — E119 Type 2 diabetes mellitus without complications: Secondary | ICD-10-CM

## 2022-04-26 NOTE — Telephone Encounter (Signed)
Pt also requesting a refill of the following:  Glipizide 10 mg tab QTY: 30 Take 1 tablet by mouth every day before breakfast.  Send Rx if appropriate.  Ottis Stain, CMA

## 2022-04-28 MED ORDER — EMPAGLIFLOZIN 25 MG PO TABS: 25.0000 mg | ORAL_TABLET | Freq: Every day | ORAL | 1 refills | Status: DC

## 2022-04-28 NOTE — Telephone Encounter (Signed)
Glipizide was discontinued by provider several months ago. Refill not appropriate and therefore not sent.

## 2022-05-02 NOTE — Progress Notes (Deleted)
    SUBJECTIVE:   CHIEF COMPLAINT / HPI:   Type 2 Diabetes Patient is a 46 y.o. female who presents today for diabetes follow-up.  Home medications include: *** Patient reports *** medication compliance. Patient checks sugar *** at home. Typically range *** *** hypoglycemic episodes/symptoms  Most recent A1Cs:  Lab Results  Component Value Date   HGBA1C 6.0 08/27/2021   HGBA1C 7.3 (A) 02/20/2021   HGBA1C 8.8 (A) 11/08/2020   Last Microalbumin, LDL, Creatinine: Lab Results  Component Value Date   LDLCALC 67 03/06/2020   CREATININE 1.08 (H) 12/04/2020    Patient {rwisisnot:24883} up to date on diabetic eye. Patient {rwisisnot:24883} up to date on diabetic foot exam.   PERTINENT  PMH / PSH: ***  OBJECTIVE:   There were no vitals taken for this visit.  ***  ASSESSMENT/PLAN:   No problem-specific Assessment & Plan notes found for this encounter.     Maury Dus, MD [facility]

## 2022-05-03 ENCOUNTER — Ambulatory Visit: Payer: Medicaid Other | Admitting: Family Medicine

## 2022-05-28 ENCOUNTER — Encounter: Payer: Self-pay | Admitting: Family Medicine

## 2022-05-28 ENCOUNTER — Ambulatory Visit (INDEPENDENT_AMBULATORY_CARE_PROVIDER_SITE_OTHER): Payer: Medicaid Other | Admitting: Family Medicine

## 2022-05-28 VITALS — BP 110/72 | HR 84 | Wt 154.0 lb

## 2022-05-28 DIAGNOSIS — E119 Type 2 diabetes mellitus without complications: Secondary | ICD-10-CM

## 2022-05-28 LAB — POCT GLYCOSYLATED HEMOGLOBIN (HGB A1C): HbA1c, POC (controlled diabetic range): 7 % (ref 0.0–7.0)

## 2022-05-28 NOTE — Assessment & Plan Note (Addendum)
Well-controlled.  A1c 7.0 today.  Goal A1c <7.0. -Continue metformin 1000 mg BID -Continue Jardiance 25 mg daily -Check updated BMP, lipid panel -Obtain urine microalbumin today -Foot exam performed today without significant abnormality -Has diabetic eye exam scheduled for January 12 -On ACEi, statin -Next A1c 3-6 months

## 2022-05-28 NOTE — Progress Notes (Signed)
    SUBJECTIVE:   CHIEF COMPLAINT / HPI:   Type 2 Diabetes Patient is a 46 y.o. female who presents today for diabetes follow-up.  Home medications include: Metformin 1000mg  BID and Jardiance 25mg  daily Patient reports excellent medication compliance. Patient checks sugar about 3x per week at home. Typically range 90 to low 100s No hypoglycemic episodes/symptoms  Most recent A1Cs:  Lab Results  Component Value Date   HGBA1C 7.0 05/28/2022   HGBA1C 6.0 08/27/2021   HGBA1C 7.3 (A) 02/20/2021   Last Microalbumin, LDL, Creatinine: Lab Results  Component Value Date   LDLCALC 67 03/06/2020   CREATININE 1.08 (H) 12/04/2020    Patient is not up to date on diabetic eye. Patient is not up to date on diabetic foot exam.   PERTINENT  PMH / PSH: cerebral palsy, congenital deaf-mutism, intellectual disability  OBJECTIVE:   BP 110/72   Pulse 84   Wt 154 lb (69.9 kg)   LMP  (LMP Unknown)   SpO2 98%   BMI 26.43 kg/m   Gen: alert, NAD CV: RRR, normal S1/S2, no murmur Resp: Normal effort, lungs CTAB GI: abd soft, NTND Extremities: no edema or cyanosis, contracture of left hand Skin: warm and dry, no rashes noted Neuro: alert, nonverbal Diabetic foot exam was performed with the following findings:   No deformities, ulcerations, or other skin breakdown Normal sensation of 10g monofilament Intact posterior tibialis and dorsalis pedis pulses Moderate size callus on plantar aspect of L great toe     ASSESSMENT/PLAN:   Diabetes mellitus without complication (HCC) Well-controlled.  A1c 7.0 today.  Goal A1c <7.0. -Continue metformin 1000 mg BID -Continue Jardiance 25 mg daily -Check updated BMP, lipid panel -Obtain urine microalbumin today -Foot exam performed today without significant abnormality -Has diabetic eye exam scheduled for January 12 -On ACEi, statin -Next A1c 3-6 months    12/06/2020, MD [facility]

## 2022-05-28 NOTE — Patient Instructions (Addendum)
It was great to see you!  Things we discussed at today's visit: - Your A1c was 7.0% today, which means your diabetes is well-controlled. Your goal A1c is 7.0 or less. Continue your current medications with no changes.  -We are checking some labs. I will send you a MyChart message with the results or call if they are abnormal.   -Please have your eye doctor fax the notes from your visit in January. Our fax is [phone].  -See me back in 3 months for her next diabetes visit  Take care and seek immediate care sooner if you develop any concerns.  Dr. [facility]

## 2022-05-30 LAB — BASIC METABOLIC PANEL
BUN/Creatinine Ratio: 9 (ref 9–23)
BUN: 10 mg/dL (ref 6–24)
CO2: 20 mmol/L (ref 20–29)
Calcium: 9.3 mg/dL (ref 8.7–10.2)
Chloride: 101 mmol/L (ref 96–106)
Creatinine, Ser: 1.12 mg/dL — ABNORMAL HIGH (ref 0.57–1.00)
Glucose: 197 mg/dL — ABNORMAL HIGH (ref 70–99)
Potassium: 4.1 mmol/L (ref 3.5–5.2)
Sodium: 141 mmol/L (ref 134–144)
eGFR: 62 mL/min/{1.73_m2} (ref >59)

## 2022-05-30 LAB — MICROALBUMIN / CREATININE URINE RATIO
Creatinine, Urine: 91.1 mg/dL
Microalb/Creat Ratio: 6 mg/g creat (ref 0–29)
Microalbumin, Urine: 5.6 ug/mL

## 2022-05-30 LAB — LIPID PANEL
Chol/HDL Ratio: 4.1 ratio (ref 0.0–4.4)
Cholesterol, Total: 169 mg/dL (ref 100–199)
HDL: 41 mg/dL (ref >39)
LDL Chol Calc (NIH): 82 mg/dL (ref 0–99)
Triglycerides: 282 mg/dL — ABNORMAL HIGH (ref 0–149)
VLDL Cholesterol Cal: 46 mg/dL — ABNORMAL HIGH (ref 5–40)

## 2022-06-11 ENCOUNTER — Other Ambulatory Visit: Payer: Self-pay | Admitting: Family Medicine

## 2022-06-11 DIAGNOSIS — F209 Schizophrenia, unspecified: Secondary | ICD-10-CM

## 2022-06-11 DIAGNOSIS — E119 Type 2 diabetes mellitus without complications: Secondary | ICD-10-CM

## 2022-07-09 ENCOUNTER — Other Ambulatory Visit: Payer: Self-pay | Admitting: Family Medicine

## 2022-07-09 DIAGNOSIS — E119 Type 2 diabetes mellitus without complications: Secondary | ICD-10-CM

## 2022-08-13 LAB — HM DIABETES EYE EXAM

## 2022-10-05 ENCOUNTER — Other Ambulatory Visit: Payer: Self-pay | Admitting: Family Medicine

## 2022-10-05 DIAGNOSIS — E1169 Type 2 diabetes mellitus with other specified complication: Secondary | ICD-10-CM

## 2022-11-22 ENCOUNTER — Other Ambulatory Visit: Payer: Self-pay | Admitting: Family Medicine

## 2022-11-22 DIAGNOSIS — E119 Type 2 diabetes mellitus without complications: Secondary | ICD-10-CM

## 2022-12-02 ENCOUNTER — Encounter: Payer: Self-pay | Admitting: Family Medicine

## 2022-12-02 ENCOUNTER — Ambulatory Visit (INDEPENDENT_AMBULATORY_CARE_PROVIDER_SITE_OTHER): Payer: Medicaid Other | Admitting: Family Medicine

## 2022-12-02 VITALS — BP 116/79 | HR 79 | Wt 142.0 lb

## 2022-12-02 DIAGNOSIS — N182 Chronic kidney disease, stage 2 (mild): Secondary | ICD-10-CM

## 2022-12-02 DIAGNOSIS — E1122 Type 2 diabetes mellitus with diabetic chronic kidney disease: Secondary | ICD-10-CM

## 2022-12-02 DIAGNOSIS — Z1211 Encounter for screening for malignant neoplasm of colon: Secondary | ICD-10-CM | POA: Diagnosis not present

## 2022-12-02 LAB — POCT GLYCOSYLATED HEMOGLOBIN (HGB A1C): HbA1c, POC (controlled diabetic range): 6.7 % (ref 0.0–7.0)

## 2022-12-02 NOTE — Assessment & Plan Note (Addendum)
Well-controlled. A1c 6.7% today. Goal A1c <7%. -Continue Metformin 1000mg  BID -Continue Jardiance 25mg  daily -Continue ACEi, statin -Had BMP & urine microalbumin within past year, renal function stable, no microalbuminuria -UTD on foot and eye exams -Next A1c 6 months

## 2022-12-02 NOTE — Patient Instructions (Addendum)
It was great to see you!  Things we discussed at today's visit: - Your A1c was 6.7% today, which means your diabetes is well-controlled. Your goal A1c is less than 7%. Continue your current medications.  We will recheck in another 6 months. - You are due for a colonoscopy (screening test for colon cancer). I have placed a referral to the gastroenterologist's office. They should call you to schedule an appointment but it may take several weeks to months.   Take care and seek immediate care sooner if you develop any concerns.  Dr. [facility]

## 2022-12-02 NOTE — Progress Notes (Signed)
    SUBJECTIVE:   CHIEF COMPLAINT / HPI:   Type 2 Diabetes Patient is a 47 y.o. female who presents today for diabetes follow-up.  Home medications include: Metformin 1000mg  BID and Jardiance 25mg  daily Patient reports excellent medication compliance. Patient checks fasting sugar few times per week at home. Typically range low 100s. No hypoglycemic episodes/symptoms.  Most recent A1Cs:  Lab Results  Component Value Date   HGBA1C 6.7 12/02/2022   HGBA1C 7.0 05/28/2022   HGBA1C 6.0 08/27/2021   Last Microalbumin, LDL, Creatinine: Lab Results  Component Value Date   LDLCALC 82 05/28/2022   CREATININE 1.12 (H) 05/28/2022    Patient is up to date on diabetic eye. Patient is up to date on diabetic foot exam.   PERTINENT  PMH / PSH: Intellectual disability, cerebral palsy, congenital deafness-mutism  OBJECTIVE:   BP 116/79   Pulse 79   Wt 142 lb (64.4 kg)   LMP 11/04/2022   SpO2 100%   BMI 24.37 kg/m   Gen: NAD HEENT: [place]/AT, PERRL, nares patent, TM normal bilaterally, dental caries noted, oropharynx otherwise umremarkable CV: RRR, normal S1/S2, no murmur Resp: Normal effort, lungs CTAB Extremities: no edema or cyanosis Skin: warm and dry, no rashes noted Neuro: alert, left upper extremity spasticity and contracture, scizzoring gait Psych: calm, nonverbal, smiles appropriately  ASSESSMENT/PLAN:   Diabetes mellitus (HCC) Well-controlled. A1c 6.7% today. Goal A1c <7%. -Continue Metformin 1000mg  BID -Continue Jardiance 25mg  daily -Continue ACEi, statin -Had BMP & urine microalbumin within past year, renal function stable, no microalbuminuria -UTD on foot and eye exams -Next A1c 6 months   Health Maintenance Screening for Colon Cancer Discussed screening options, patient would like to proceed with colonoscopy. Referral to GI placed.  Maury Dus, MD [facility]

## 2023-03-03 ENCOUNTER — Encounter: Payer: Self-pay | Admitting: Family Medicine

## 2023-03-10 ENCOUNTER — Other Ambulatory Visit: Payer: Self-pay

## 2023-03-10 DIAGNOSIS — E119 Type 2 diabetes mellitus without complications: Secondary | ICD-10-CM

## 2023-03-10 MED ORDER — METFORMIN HCL ER 500 MG PO TB24: 1000.0000 mg | ORAL_TABLET | Freq: Two times a day (BID) | ORAL | 2 refills | Status: DC

## 2023-04-02 ENCOUNTER — Telehealth: Payer: Self-pay | Admitting: Family Medicine

## 2023-04-02 NOTE — Telephone Encounter (Signed)
Patient's social worker dropped off [employer] transportation forms to be completed. Last DOS was 12/02/22. Placed in [employer].

## 2023-04-03 NOTE — Telephone Encounter (Signed)
Reviewed form and placed in PCP's box for completion.  .Dontrelle Mazon R Damoni Erker, CMA  

## 2023-04-04 NOTE — Telephone Encounter (Signed)
Called [place] SW who left form for patient.   She did not answer, however informed her the form was ready for pick up up front.   Copy was made for batch scanning.

## 2023-05-08 ENCOUNTER — Encounter: Payer: Self-pay | Admitting: Family Medicine

## 2023-05-08 ENCOUNTER — Ambulatory Visit: Payer: MEDICAID | Admitting: Family Medicine

## 2023-05-08 VITALS — BP 117/80 | HR 112 | Temp 98.4°F | Ht 64.0 in | Wt 145.2 lb

## 2023-05-08 DIAGNOSIS — Z0000 Encounter for general adult medical examination without abnormal findings: Secondary | ICD-10-CM | POA: Diagnosis not present

## 2023-05-08 DIAGNOSIS — E1169 Type 2 diabetes mellitus with other specified complication: Secondary | ICD-10-CM

## 2023-05-08 DIAGNOSIS — Z23 Encounter for immunization: Secondary | ICD-10-CM | POA: Diagnosis not present

## 2023-05-08 DIAGNOSIS — Z7984 Long term (current) use of oral hypoglycemic drugs: Secondary | ICD-10-CM

## 2023-05-08 DIAGNOSIS — N182 Chronic kidney disease, stage 2 (mild): Secondary | ICD-10-CM

## 2023-05-08 DIAGNOSIS — E1122 Type 2 diabetes mellitus with diabetic chronic kidney disease: Secondary | ICD-10-CM

## 2023-05-08 DIAGNOSIS — N898 Other specified noninflammatory disorders of vagina: Secondary | ICD-10-CM | POA: Diagnosis not present

## 2023-05-08 DIAGNOSIS — E119 Type 2 diabetes mellitus without complications: Secondary | ICD-10-CM | POA: Diagnosis not present

## 2023-05-08 DIAGNOSIS — E785 Hyperlipidemia, unspecified: Secondary | ICD-10-CM

## 2023-05-08 LAB — POCT GLYCOSYLATED HEMOGLOBIN (HGB A1C): HbA1c, POC (controlled diabetic range): 6.7 % (ref 0.0–7.0)

## 2023-05-08 LAB — POCT WET PREP (WET MOUNT)
Clue Cells Wet Prep Whiff POC: NEGATIVE
Trichomonas Wet Prep HPF POC: ABSENT

## 2023-05-08 MED ORDER — DEXCOM G7 SENSOR MISC: 1.0000 | 2 refills | Status: DC

## 2023-05-08 MED ORDER — LISINOPRIL 5 MG PO TABS: 5.0000 mg | ORAL_TABLET | Freq: Every day | ORAL | 3 refills | Status: DC

## 2023-05-08 MED ORDER — CLOTRIMAZOLE 1 % EX CREA: 1.0000 | TOPICAL_CREAM | Freq: Two times a day (BID) | CUTANEOUS | 0 refills | Status: DC

## 2023-05-08 MED ORDER — FLUCONAZOLE 150 MG PO TABS: 150.0000 mg | ORAL_TABLET | Freq: Once | ORAL | 0 refills | Status: AC

## 2023-05-08 NOTE — Assessment & Plan Note (Addendum)
Wet prep performed with evidence of yeast. Some concern that Jardiance may be contributing; if this problem continues will discuss discontinuing Jardiance and possibly starting GLP1. - fluconazole x2; counseled to use 2nd dose 3 days after first if symptoms persist - clotrimazole cream 2x daily to external vulva

## 2023-05-08 NOTE — Progress Notes (Signed)
    SUBJECTIVE:   CHIEF COMPLAINT / HPI:  Patient is deaf and mute.  Accompanied by family member who helps provide the history.  DM check up Check sugars 2-3 times a week at home, averages in the 90s.  No hypo or hyperglycemic episodes.  Takes Jardiance and metformin daily.  Vaginal itching States this feels just like the last time that she had a UTI.  Denies burning or discomfort while urinating.  No urgency.  Healthcare maintenance Due for flu, covid, tdap -would like to get these today Due for colonoscopy -requests referral Due for mammogram -requests information on scheduling  PERTINENT  PMH / PSH:  T2DM Hyperlipidemia  OBJECTIVE:   BP 117/80   Pulse (!) 112   Temp 98.4 F (36.9 C)   Ht 5\' 4"  (1.626 m)   Wt 145 lb 3.2 oz (65.9 kg)   SpO2 97%   BMI 24.92 kg/m   General: Well-appearing, no acute distress. Cardio: Regular rate, regular rhythm, no murmurs on exam. Pulm: Clear, no wheezing, no crackles. No increased work of breathing. Abdominal: bowel sounds present, soft, non-tender, non-distended.  No CVA tenderness. GU Exam:  External exam: Vulva with bright pink macerated skin with whitish delineation from areas of normal skin.  Vaginal exam notable for thick white discharge. Chaperoned exam, CMA Shelly.   ASSESSMENT/PLAN:   Healthcare maintenance Given influenza, Tdap vaccines today.  Due for colonoscopy and mammogram now.  Due for urine creatinine and BMP in December. -New COVID vaccines available in this clinic today.  Given instructions to get this at her pharmacy. - Referral for colonoscopy sent - Instructions given for mammogram scheduling - Patient to schedule lab visit for urine creatinine and BMP in December on checkout today  Hyperlipidemia associated with type 2 diabetes mellitus (HCC) Well-controlled. A1c today 6.7, consistent with last check. - Continue metformin 1000mg  2x daily and jardiance 25mg  daily - A1c, urine cr ratio, BMP today  Vaginal  itching Wet prep performed with evidence of yeast. Some concern that Jardiance may be contributing; if this problem continues will discuss discontinuing Jardiance and possibly starting GLP1. - fluconazole x2; counseled to use 2nd dose 3 days after first if symptoms persist - clotrimazole cream 2x daily to external vulva      Cyndia Skeeters, DO [facility]

## 2023-05-08 NOTE — Assessment & Plan Note (Signed)
Given influenza, Tdap vaccines today.  Due for colonoscopy and mammogram now.  Due for urine creatinine and BMP in December. -New COVID vaccines available in this clinic today.  Given instructions to get this at her pharmacy. - Referral for colonoscopy sent - Instructions given for mammogram scheduling - Patient to schedule lab visit for urine creatinine and BMP in December on checkout today

## 2023-05-08 NOTE — Patient Instructions (Signed)
Dear Marissa Mcneil  Today we discussed the following concerns and plans:  Vaginal itching: -You have a yeast infection.  I will send in a prescription to your pharmacy.  Diabetes: -Continue taking your metformin and Jardiance.  Received your flu and Tdap vaccines today.  We were out of the COVID shots; please get this at your pharmacy. I have sent in a referral to GI for colonoscopy.  If you do not hear from them please give me a call.  Please call [facility] to schedule your mammogram. Their number is [phone].   If you have any concerns, please call the clinic or schedule an appointment.  It was a pleasure to take care of you today. Be well!  Cyndia Skeeters, DO [facility], PGY-1

## 2023-05-08 NOTE — Assessment & Plan Note (Addendum)
Well-controlled. A1c today 6.7, consistent with last check. - Continue metformin 1000mg  2x daily and jardiance 25mg  daily - A1c, urine cr ratio, BMP today

## 2023-05-09 LAB — BASIC METABOLIC PANEL
BUN/Creatinine Ratio: 10 (ref 9–23)
BUN: 11 mg/dL (ref 6–24)
CO2: 21 mmol/L (ref 20–29)
Calcium: 9.8 mg/dL (ref 8.7–10.2)
Chloride: 99 mmol/L (ref 96–106)
Creatinine, Ser: 1.07 mg/dL — ABNORMAL HIGH (ref 0.57–1.00)
Glucose: 123 mg/dL — ABNORMAL HIGH (ref 70–99)
Potassium: 4.3 mmol/L (ref 3.5–5.2)
Sodium: 138 mmol/L (ref 134–144)
eGFR: 65 mL/min/{1.73_m2} (ref >59)

## 2023-05-13 ENCOUNTER — Other Ambulatory Visit: Payer: Self-pay | Admitting: Family Medicine

## 2023-05-13 DIAGNOSIS — Z1231 Encounter for screening mammogram for malignant neoplasm of breast: Secondary | ICD-10-CM

## 2023-05-16 ENCOUNTER — Telehealth: Payer: Self-pay

## 2023-05-16 NOTE — Telephone Encounter (Signed)
Pharmacy Patient Advocate Encounter   Received notification from CoverMyMeds that prior authorization for [facility] SENSORS is required/requested.  The patient is insured through [facility] .   PA required; PA started via CoverMyMeds. KEY [id number] . Waiting for clinical questions to populate.

## 2023-05-19 NOTE — Telephone Encounter (Signed)
Pharmacy Patient Advocate Encounter   PA required; PA submitted to above mentioned insurance via CoverMyMeds Key/confirmation #/EOC [id number]. Status is pending

## 2023-05-21 NOTE — Telephone Encounter (Signed)
Pharmacy Patient Advocate Encounter  Received notification from [facility] that Prior Authorization for [facility] G7 SENSOR has been DENIED.  Full denial letter will be uploaded to the media tab. See denial reason below.    PA #/Case ID/Reference #: [id number]

## 2023-06-15 IMAGING — MG MM DIGITAL DIAGNOSTIC UNILAT*R* W/ TOMO W/ CAD
6 series · 6 of 18 positions shown · non-contrast
Comparison: Previous exam(s).

CLINICAL DATA: Patient returns for further evaluation of the RIGHT
breast. Recent screening study shows possible asymmetry in the RIGHT
axilla and RIGHT breast.

EXAM:
DIGITAL DIAGNOSTIC UNILATERAL RIGHT MAMMOGRAM WITH TOMOSYNTHESIS AND
CAD
TECHNIQUE: Right digital diagnostic mammography and breast tomosynthesis was
performed. The images were evaluated with computer-aided detection.

[R CC synth-2D]
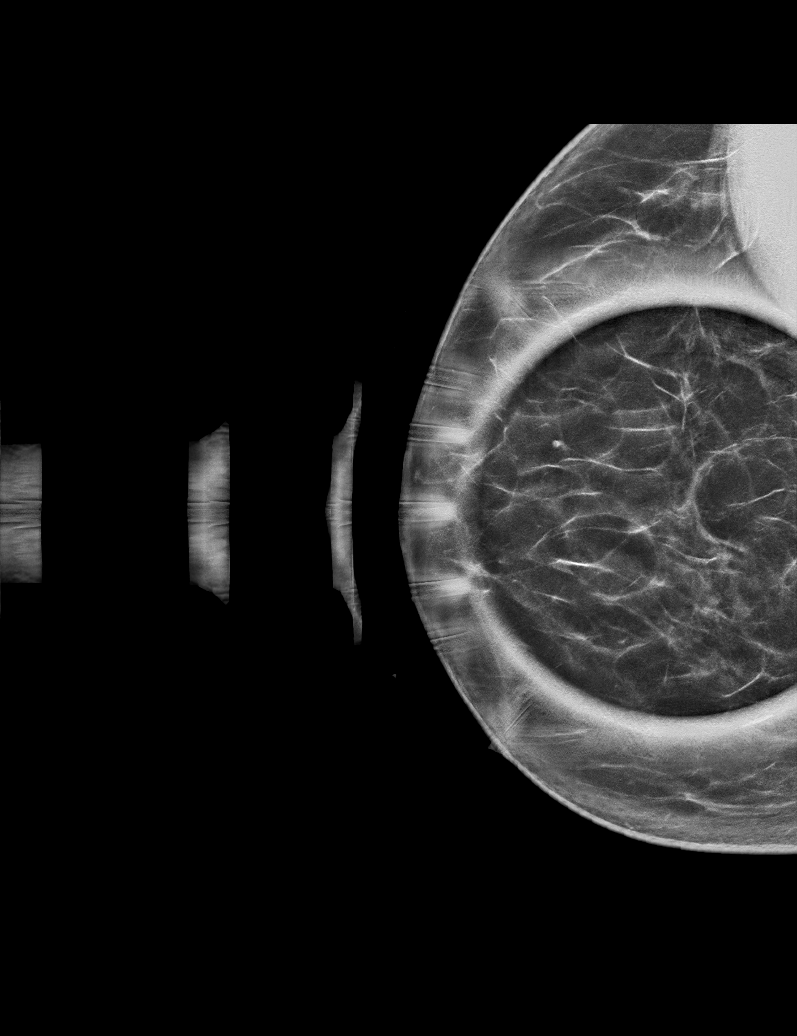

[R MLO synth-2D (1 of 2)]
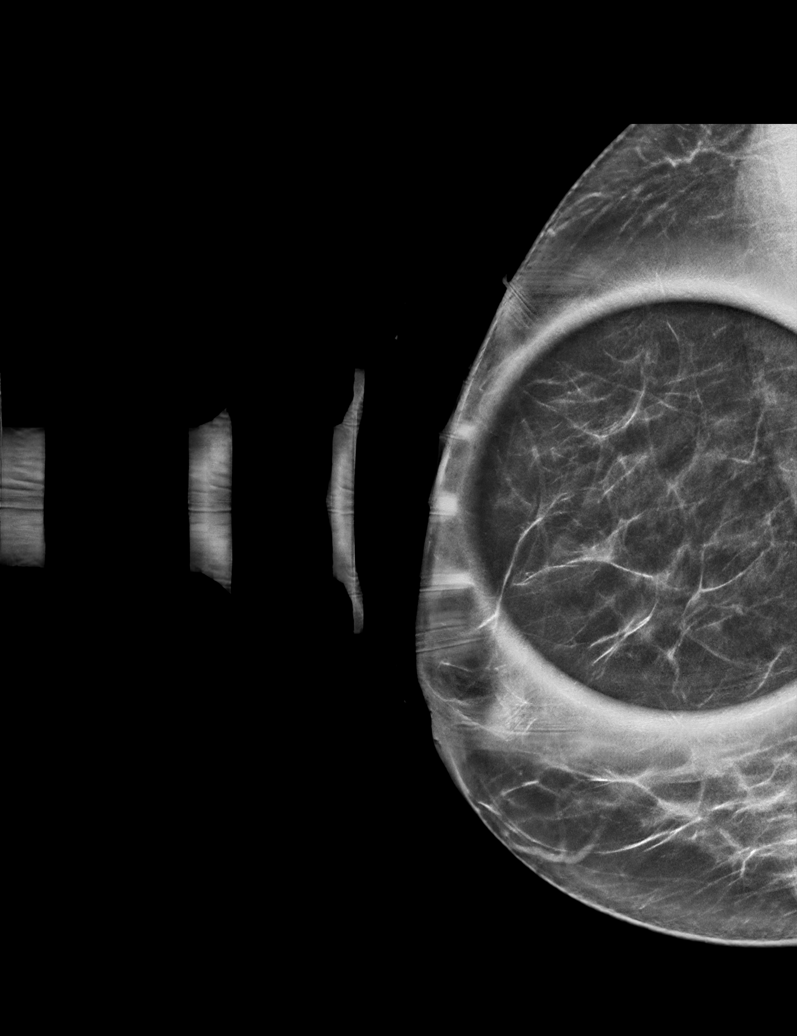

[R MLO synth-2D (2 of 2)]
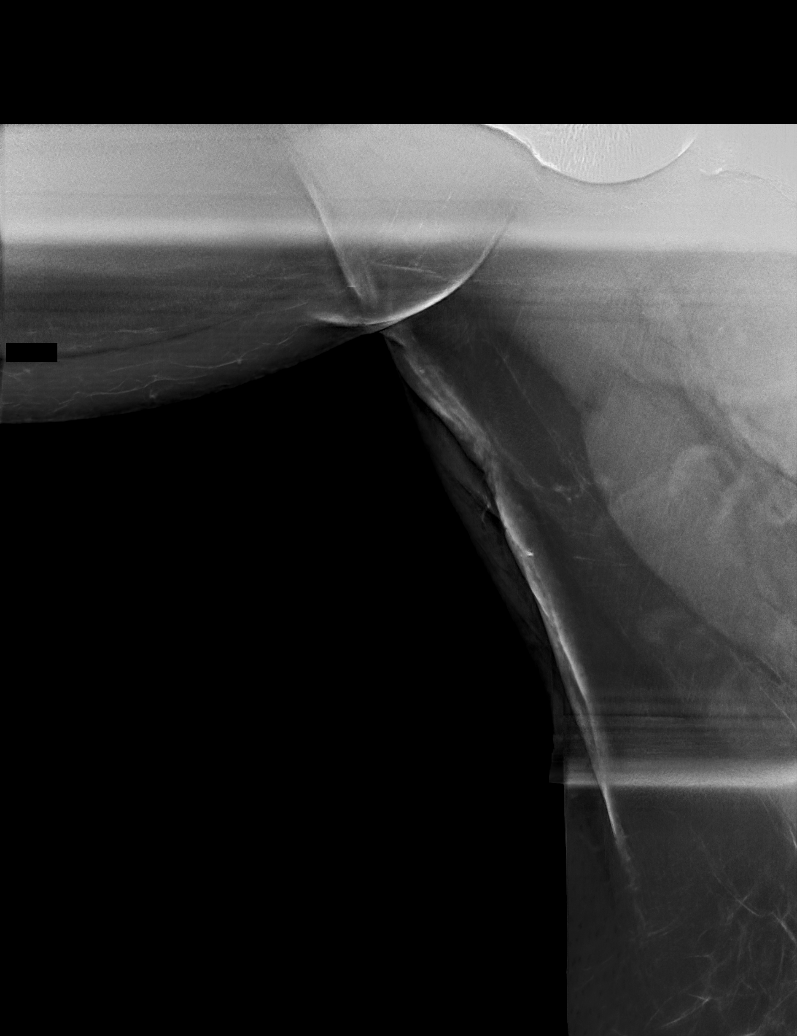

[R CC tomo · tomo slice 33/66.0]
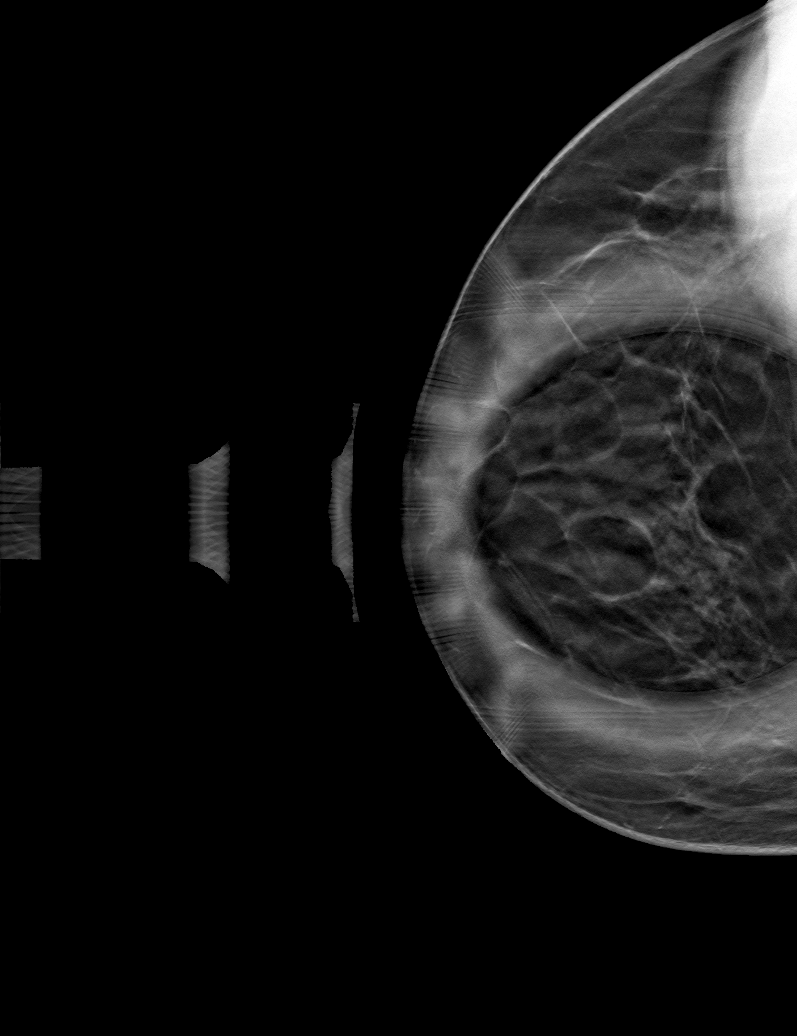

[R MLO tomo (1 of 2) · tomo slice 33/65.0]
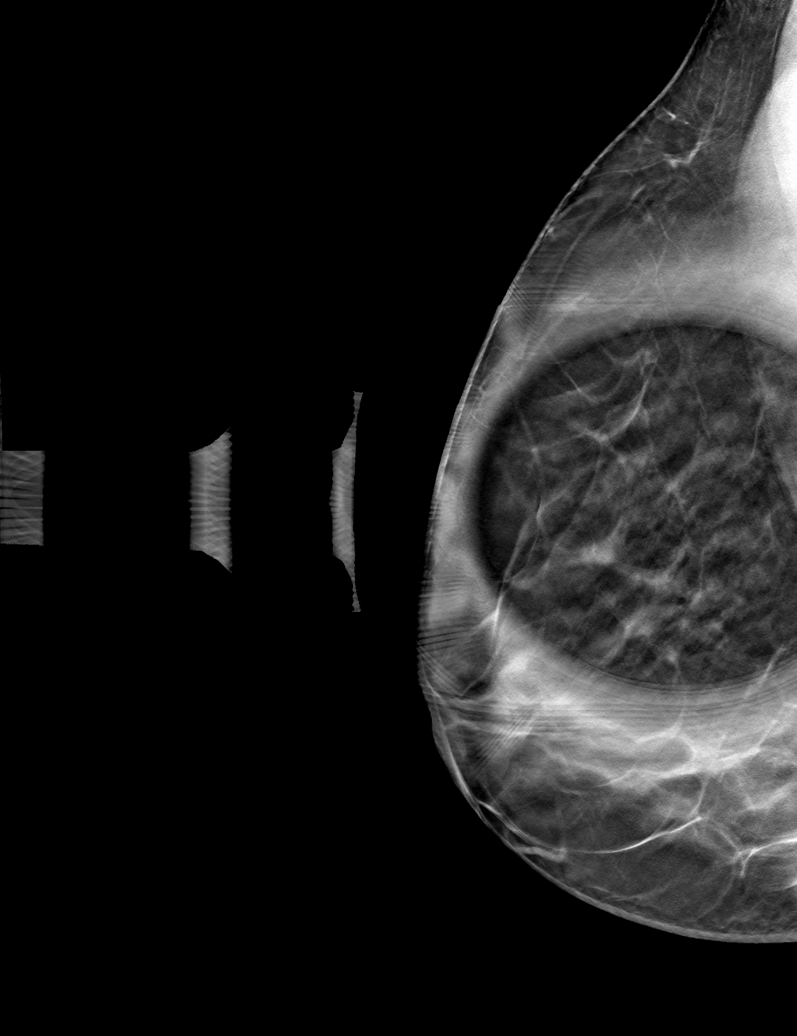

[R MLO tomo (2 of 2) · tomo slice 57/114.0]
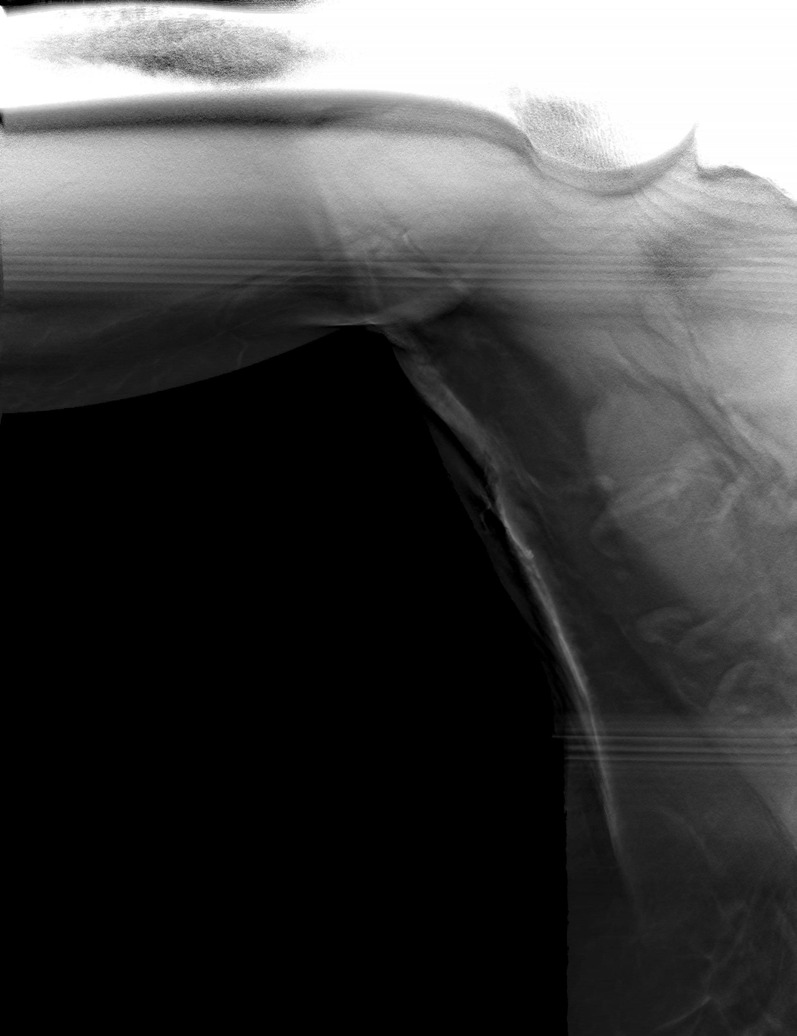

[6 of 18 positions shown; findings below may reference images not displayed]

ACR Breast Density Category c: The breast tissue is heterogeneously
dense, which may obscure small masses.
FINDINGS: Additional 2-D and 3-D images are performed. These views show no
persistent asymmetry in the breasts. Evaluation of the RIGHT axilla
is unremarkable.
IMPRESSION: No mammographic evidence for malignancy.

RECOMMENDATION:
Screening mammogram in one year.(Code:EU-[id number])

I have discussed the findings and recommendations with the patient
with the assistance of a sign language interpreter. If applicable, a
reminder letter will be sent to the patient regarding the next
appointment.

BI-RADS CATEGORY  1: Negative.

## 2023-06-16 ENCOUNTER — Other Ambulatory Visit: Payer: Self-pay | Admitting: Family Medicine

## 2023-06-16 ENCOUNTER — Ambulatory Visit
Admission: RE | Admit: 2023-06-16 | Discharge: 2023-06-16 | Disposition: A | Discharge status: Home / Self Care | Payer: MEDICAID | Source: Ambulatory Visit | Attending: Family Medicine | Admitting: Family Medicine

## 2023-06-16 DIAGNOSIS — E119 Type 2 diabetes mellitus without complications: Secondary | ICD-10-CM

## 2023-06-16 DIAGNOSIS — Z1231 Encounter for screening mammogram for malignant neoplasm of breast: Secondary | ICD-10-CM

## 2023-07-09 ENCOUNTER — Other Ambulatory Visit: Payer: Self-pay

## 2023-07-09 DIAGNOSIS — E119 Type 2 diabetes mellitus without complications: Secondary | ICD-10-CM

## 2023-07-09 DIAGNOSIS — E1169 Type 2 diabetes mellitus with other specified complication: Secondary | ICD-10-CM

## 2023-07-09 DIAGNOSIS — F209 Schizophrenia, unspecified: Secondary | ICD-10-CM

## 2023-07-09 MED ORDER — EMPAGLIFLOZIN 25 MG PO TABS: 25.0000 mg | ORAL_TABLET | Freq: Every day | ORAL | 1 refills | Status: DC

## 2023-07-09 MED ORDER — PRAVASTATIN SODIUM 40 MG PO TABS: 40.0000 mg | ORAL_TABLET | Freq: Every day | ORAL | 1 refills | Status: DC

## 2023-07-09 MED ORDER — ARIPIPRAZOLE 5 MG PO TABS: 5.0000 mg | ORAL_TABLET | Freq: Every day | ORAL | 1 refills | Status: DC

## 2023-08-11 ENCOUNTER — Encounter: Payer: Self-pay | Admitting: Internal Medicine

## 2023-08-21 NOTE — Progress Notes (Signed)
  SUBJECTIVE:   CHIEF COMPLAINT / HPI:   Type 2 Diabetes: Home medications include: Metformin 1000 mg twice daily, Jardiance 25 mg daily. Does endorse compliance. Home glucose monitoring with CGM. Patient is not up to date on diabetic eye.  Patient is able to read lips and her sister accompanies her to today's visit.  PERTINENT  PMH / PSH: T2DM, HLD, schizophrenia, cerebral palsy, deaf mutism  OBJECTIVE:  BP 109/77   Pulse 93   Wt 150 lb 3.2 oz (68.1 kg)   SpO2 99%   BMI 25.78 kg/m  General: Well-appearing, NAD CV: RRR, no murmurs auscultated Pulm: normal WOB  ASSESSMENT/PLAN:   Assessment & Plan Type 2 diabetes mellitus with stage 2 chronic kidney disease, without long-term current use of insulin (HCC) Well-controlled, A1c 7.2.  Continue metformin 1000 mg twice daily, Jardiance 25 mg daily.  Not a candidate for CGM monitor given no hypoglycemic agents.  Check microalbumin/creatinine ratio.  She has ophthalmology appointment on 08/29/2023 at [facility].  Attempted to obtain records release form although patient left before able to give. Return in about 3 months (around 11/19/2023) for Diabetes follow-up. Shelby Mattocks, DO 08/22/2023, 9:15 AM PGY-3, [facility]

## 2023-08-22 ENCOUNTER — Ambulatory Visit (INDEPENDENT_AMBULATORY_CARE_PROVIDER_SITE_OTHER): Payer: MEDICAID | Admitting: Student

## 2023-08-22 ENCOUNTER — Encounter: Payer: Self-pay | Admitting: Student

## 2023-08-22 VITALS — BP 109/77 | HR 93 | Wt 150.2 lb

## 2023-08-22 DIAGNOSIS — E1122 Type 2 diabetes mellitus with diabetic chronic kidney disease: Secondary | ICD-10-CM | POA: Diagnosis not present

## 2023-08-22 DIAGNOSIS — N182 Chronic kidney disease, stage 2 (mild): Secondary | ICD-10-CM

## 2023-08-22 LAB — POCT GLYCOSYLATED HEMOGLOBIN (HGB A1C): HbA1c, POC (controlled diabetic range): 7.2 % — AB (ref 0.0–7.0)

## 2023-08-22 NOTE — Assessment & Plan Note (Addendum)
 Well-controlled, A1c 7.2.  Continue metformin 1000 mg twice daily, Jardiance 25 mg daily.  Not a candidate for CGM monitor given no hypoglycemic agents.  Check microalbumin/creatinine ratio.  She has ophthalmology appointment on 08/29/2023 at [facility].  Attempted to obtain records release form although patient left before able to give.

## 2023-08-22 NOTE — Patient Instructions (Addendum)
 It was great to see you today! Thank you for choosing [facility] for your primary care.  Today we addressed: Your diabetes is well-controlled.  A CGM would not be beneficial given you are not on insulin.  I recommend returning in 3 months for another A1c check.  I will be in touch with you regarding the urine results.  I am glad you have an appointment for your eyes.  We always want to make sure we get records from any specialist you see so we can keep track of your care.  If you haven't already, sign up for My Chart to have easy access to your labs results, and communication with your primary care physician. We are checking some labs today. If they are abnormal, I will call you. If they are normal, I will send you a MyChart message (if it is active) or a letter in the mail. If you do not hear about your labs in the next 2 weeks, please call the office. Return in about 3 months (around 11/19/2023) for Diabetes follow-up. Please arrive 15 minutes before your appointment to ensure smooth check in process.  We appreciate your efforts in making this happen.  Thank you for allowing me to participate in your care, Shelby Mattocks, DO 08/22/2023, 9:14 AM PGY-3, [facility]

## 2023-08-24 LAB — MICROALBUMIN / CREATININE URINE RATIO
Creatinine, Urine: 107.8 mg/dL
Microalb/Creat Ratio: 6 mg/g{creat} (ref 0–29)
Microalbumin, Urine: 6 ug/mL

## 2023-08-25 ENCOUNTER — Encounter: Payer: Self-pay | Admitting: Student

## 2023-08-29 LAB — OPHTHALMOLOGY REPORT-SCANNED

## 2023-09-01 ENCOUNTER — Ambulatory Visit (AMBULATORY_SURGERY_CENTER): Payer: MEDICAID

## 2023-09-01 VITALS — Ht 64.0 in | Wt 151.0 lb

## 2023-09-01 DIAGNOSIS — Z1211 Encounter for screening for malignant neoplasm of colon: Secondary | ICD-10-CM

## 2023-09-01 MED ORDER — NA SULFATE-K SULFATE-MG SULF 17.5-3.13-1.6 GM/177ML PO SOLN: 1.0000 | Freq: Once | ORAL | 0 refills | Status: DC

## 2023-09-01 MED ORDER — NA SULFATE-K SULFATE-MG SULF 17.5-3.13-1.6 GM/177ML PO SOLN: 1.0000 | Freq: Once | ORAL | 0 refills | Status: AC

## 2023-09-01 NOTE — Progress Notes (Signed)
 No egg or soy allergy known to patient  No issues known to pt with past sedation with any surgeries or procedures Patient denies ever being told they had issues or difficulty with intubation  No FH of Malignant Hyperthermia Pt is not on diet pills Pt is not on  home 02  Pt is not on blood thinners  Pt denies issues with constipation  No A fib or A flutter Have any cardiac testing pending--no  LOA: independent  Prep: suprep  Patient's chart reviewed by Cathlyn Parsons CNRA prior to previsit and patient appropriate for the [facility].  Previsit completed and red dot placed by patient's name on their procedure day (on provider's schedule).     PV completed with patient and her sister using tele interpreter. Prep instructions sent via mychart and home address.

## 2023-09-02 ENCOUNTER — Other Ambulatory Visit: Payer: Self-pay | Admitting: Family Medicine

## 2023-09-02 DIAGNOSIS — E119 Type 2 diabetes mellitus without complications: Secondary | ICD-10-CM

## 2023-09-12 ENCOUNTER — Ambulatory Visit: Payer: MEDICAID | Admitting: Internal Medicine

## 2023-09-12 ENCOUNTER — Encounter: Payer: Self-pay | Admitting: Internal Medicine

## 2023-09-12 VITALS — BP 104/67 | HR 73 | Temp 98.8°F | Resp 12 | Ht 64.0 in | Wt 147.0 lb

## 2023-09-12 DIAGNOSIS — D120 Benign neoplasm of cecum: Secondary | ICD-10-CM

## 2023-09-12 DIAGNOSIS — D122 Benign neoplasm of ascending colon: Secondary | ICD-10-CM | POA: Diagnosis not present

## 2023-09-12 DIAGNOSIS — D123 Benign neoplasm of transverse colon: Secondary | ICD-10-CM

## 2023-09-12 DIAGNOSIS — Z1211 Encounter for screening for malignant neoplasm of colon: Secondary | ICD-10-CM | POA: Diagnosis not present

## 2023-09-12 DIAGNOSIS — K648 Other hemorrhoids: Secondary | ICD-10-CM

## 2023-09-12 MED ORDER — SODIUM CHLORIDE 0.9 % IV SOLN: 500.0000 mL | INTRAVENOUS | Status: DC

## 2023-09-12 NOTE — Progress Notes (Signed)
 Called to room to assist during endoscopic procedure.  Patient ID and intended procedure confirmed with present staff. Received instructions for my participation in the procedure from the performing physician.

## 2023-09-12 NOTE — Progress Notes (Signed)
 Report to PACU, RN, vss, BBS= Clear.

## 2023-09-12 NOTE — Op Note (Signed)
 [facility] Patient Name: Marissa Mcneil Procedure Date: 09/12/2023 1:34 PM MRN: [id number] Endoscopist: Madelyn Brunner [place] , , [id number] Age: 48 Referring MD:  Date of Birth: 06-Feb-1976 Gender: Female Account #: [id number] Procedure:                Colonoscopy Indications:              Screening for colorectal malignant neoplasm, This                            is the patient's first colonoscopy Medicines:                Monitored Anesthesia Care Procedure:                Pre-Anesthesia Assessment:                           - Prior to the procedure, a History and Physical                            was performed, and patient medications and                            allergies were reviewed. The patient's tolerance of                            previous anesthesia was also reviewed. The risks                            and benefits of the procedure and the sedation                            options and risks were discussed with the patient.                            All questions were answered, and informed consent                            was obtained. Prior Anticoagulants: The patient has                            taken no anticoagulant or antiplatelet agents. ASA                            Grade Assessment: III - A patient with severe                            systemic disease. After reviewing the risks and                            benefits, the patient was deemed in satisfactory                            condition to undergo the procedure.  After obtaining informed consent, the colonoscope                            was passed under direct vision. Throughout the                            procedure, the patient's blood pressure, pulse, and                            oxygen saturations were monitored continuously. The                            Olympus Scope SN: [id number] was introduced through                            the anus and  advanced to the the terminal ileum.                            The colonoscopy was performed without difficulty.                            The patient tolerated the procedure well. The                            quality of the bowel preparation was good. The                            terminal ileum, ileocecal valve, appendiceal                            orifice, and rectum were photographed. Scope In: 1:43:34 PM Scope Out: 1:58:24 PM Scope Withdrawal Time: 0 hours 11 minutes 20 seconds  Total Procedure Duration: 0 hours 14 minutes 50 seconds  Findings:                 The terminal ileum appeared normal.                           Four sessile polyps were found in the transverse                            colon, ascending colon and cecum. The polyps were 3                            to 10 mm in size. These polyps were removed with a                            cold snare. Resection and retrieval were complete.                           Non-bleeding internal hemorrhoids were found during                            retroflexion. Complications:  No immediate complications. Estimated Blood Loss:     Estimated blood loss was minimal. Impression:               - The examined portion of the ileum was normal.                           - Four 3 to 10 mm polyps in the transverse colon,                            in the ascending colon and in the cecum, removed                            with a cold snare. Resected and retrieved.                           - Non-bleeding internal hemorrhoids. Recommendation:           - Discharge patient to home (with escort).                           - Await pathology results.                           - The findings and recommendations were discussed                            with the patient. Dr Particia Lather "Alan Ripper" Donnellson,  09/12/2023 2:05:56 PM

## 2023-09-12 NOTE — Patient Instructions (Signed)
 Resume ll of your previous medications today as ordered.  Read your discharge instructions.  YOU HAD AN ENDOSCOPIC PROCEDURE TODAY AT THE [facility]:   Refer to the procedure report that was given to you for any specific questions about what was found during the examination.  If the procedure report does not answer your questions, please call your gastroenterologist to clarify.  If you requested that your care partner not be given the details of your procedure findings, then the procedure report has been included in a sealed envelope for you to review at your convenience later.  YOU SHOULD EXPECT: Some feelings of bloating in the abdomen. Passage of more gas than usual.  Walking can help get rid of the air that was put into your GI tract during the procedure and reduce the bloating. If you had a lower endoscopy (such as a colonoscopy or flexible sigmoidoscopy) you may notice spotting of blood in your stool or on the toilet paper. If you underwent a bowel prep for your procedure, you may not have a normal bowel movement for a few days.  Please Note:  You might notice some irritation and congestion in your nose or some drainage.  This is from the oxygen used during your procedure.  There is no need for concern and it should clear up in a day or so.  SYMPTOMS TO REPORT IMMEDIATELY:  Following lower endoscopy (colonoscopy or flexible sigmoidoscopy):  Excessive amounts of blood in the stool  Significant tenderness or worsening of abdominal pains  Swelling of the abdomen that is new, acute  Fever of 100F or higher   For urgent or emergent issues, a gastroenterologist can be reached at any hour by calling [phone]. Do not use MyChart messaging for urgent concerns.    DIET:  We do recommend a small meal at first, but then you may proceed to your regular diet.  Drink plenty of fluids but you should avoid alcoholic beverages for 24 hours.  ACTIVITY:  You should plan to take it easy  for the rest of today and you should NOT DRIVE or use heavy machinery until tomorrow (because of the sedation medicines used during the test).    FOLLOW UP: Our staff will call the number listed on your records the next business day following your procedure.  We will call around 7:15- 8:00 am to check on you and address any questions or concerns that you may have regarding the information given to you following your procedure. If we do not reach you, we will leave a message.     If any biopsies were taken you will be contacted by phone or by letter within the next 1-3 weeks.  Please call us at [phone] if you have not heard about the biopsies in 3 weeks.    SIGNATURES/CONFIDENTIALITY: You and/or your care partner have signed paperwork which will be entered into your electronic medical record.  These signatures attest to the fact that that the information above on your After Visit Summary has been reviewed and is understood.  Full responsibility of the confidentiality of this discharge information lies with you and/or your care-partner.

## 2023-09-12 NOTE — Progress Notes (Signed)
 GASTROENTEROLOGY PROCEDURE H&P NOTE   Primary Care Physician: Cyndia Skeeters, DO    Reason for Procedure:   Colon cancer screening  Plan:    Colonoscopy  Patient is appropriate for endoscopic procedure(s) in the ambulatory ([facility]) setting.  The nature of the procedure, as well as the risks, benefits, and alternatives were carefully and thoroughly reviewed with the patient. Ample time for discussion and questions allowed. The patient understood, was satisfied, and agreed to proceed.     HPI: Marissa Mcneil is a 48 y.o. female who presents for colonoscopy for colon cancer screening. Denies blood in stools. Denies family history of colon cancer. This is her first colonoscopy.   Past Medical History:  Diagnosis Date   Cerebral palsy (HCC)    Deaf    Diabetes (HCC)    Hyperlipidemia    Intellectual disability    Schizophrenia (HCC)     History reviewed. No pertinent surgical history.  Prior to Admission medications   Medication Sig Start Date End Date Taking? Authorizing Provider  ARIPiprazole (ABILIFY) 5 MG tablet Take 1 tablet (5 mg total) by mouth daily. 07/09/23  Yes Cyndia Skeeters, DO  Continuous Glucose Sensor (DEXCOM G7 SENSOR) MISC Place 1 Device onto the skin See admin instructions. Place on your upper arm following instructions in the package. 05/08/23  Yes Cyndia Skeeters, DO  empagliflozin (JARDIANCE) 25 MG TABS tablet Take 1 tablet (25 mg total) by mouth daily. 07/09/23  Yes Cyndia Skeeters, DO  lisinopril (ZESTRIL) 5 MG tablet Take 1 tablet (5 mg total) by mouth daily. 05/08/23  Yes Cyndia Skeeters, DO  metFORMIN (GLUCOPHAGE-XR) 500 MG 24 hr tablet TAKE 2 TABLETS BY MOUTH TWICE A DAY 09/02/23  Yes Cyndia Skeeters, DO  pravastatin (PRAVACHOL) 40 MG tablet Take 1 tablet (40 mg total) by mouth daily. 07/09/23  Yes Cyndia Skeeters, DO  clotrimazole (LOTRIMIN) 1 % cream Apply 1 Application topically 2 (two) times daily. Apply to the affected area externally. Patient not taking:  Reported on 09/12/2023 05/08/23   Cyndia Skeeters, DO  fluconazole (DIFLUCAN) 150 MG tablet Take by mouth. Patient not taking: Reported on 09/12/2023 05/08/23   [provider]    Current Outpatient Medications  Medication Sig Dispense Refill   ARIPiprazole (ABILIFY) 5 MG tablet Take 1 tablet (5 mg total) by mouth daily. 90 tablet 1   Continuous Glucose Sensor (DEXCOM G7 SENSOR) MISC Place 1 Device onto the skin See admin instructions. Place on your upper arm following instructions in the package. 1 each 2   empagliflozin (JARDIANCE) 25 MG TABS tablet Take 1 tablet (25 mg total) by mouth daily. 90 tablet 1   lisinopril (ZESTRIL) 5 MG tablet Take 1 tablet (5 mg total) by mouth daily. 90 tablet 3   metFORMIN (GLUCOPHAGE-XR) 500 MG 24 hr tablet TAKE 2 TABLETS BY MOUTH TWICE A DAY 180 tablet 7   pravastatin (PRAVACHOL) 40 MG tablet Take 1 tablet (40 mg total) by mouth daily. 90 tablet 1   clotrimazole (LOTRIMIN) 1 % cream Apply 1 Application topically 2 (two) times daily. Apply to the affected area externally. (Patient not taking: Reported on 09/12/2023) 30 g 0   fluconazole (DIFLUCAN) 150 MG tablet Take by mouth. (Patient not taking: Reported on 09/12/2023)     Current Facility-Administered Medications  Medication Dose Route Frequency Provider Last Rate Last Admin   0.9 %  sodium chloride infusion  500 mL Intravenous Continuous Imogene Burn, MD        Allergies as of  09/12/2023   (No Known Allergies)    Family History  Problem Relation Age of Onset   Diabetes Mother    Breast cancer Maternal Grandmother    Heart attack Maternal Grandmother    Colitis Neg Hx    Rectal cancer Neg Hx    Stomach cancer Neg Hx     Social History   Socioeconomic History   Marital status: Single    Spouse name: Not on file   Number of children: Not on file   Years of education: Not on file   Highest education level: Not on file  Occupational History   Occupation: none  Tobacco Use   Smoking  status: Never   Smokeless tobacco: Never  Substance and Sexual Activity   Alcohol use: Never   Drug use: Never   Sexual activity: Never  Other Topics Concern   Not on file  Social History Narrative   Not on file   Social Drivers of Health   Financial Resource Strain: Not on file  Food Insecurity: Not on file  Transportation Needs: Not on file  Physical Activity: Not on file  Stress: Not on file  Social Connections: Not on file  Intimate Partner Violence: Not on file    Physical Exam: Vital signs in last 24 hours: BP 130/75   Pulse 93   Temp 98.8 F (37.1 C)   Ht 5\' 4"  (1.626 m)   Wt 147 lb (66.7 kg)   SpO2 100%   BMI 25.23 kg/m  GEN: NAD EYE: Sclerae anicteric ENT: MMM CV: Non-tachycardic Pulm: No increased work of breathing GI: Soft, NT/ND NEURO:  Alert & Oriented   Eulah Pont, MD [facility]  09/12/2023 1:36 PM

## 2023-09-15 ENCOUNTER — Telehealth: Payer: Self-pay | Admitting: *Deleted

## 2023-09-15 NOTE — Telephone Encounter (Signed)
  Follow up Call-     09/12/2023   12:56 PM  Call back number  Post procedure Call Back phone  # [phone]  Permission to leave phone message Yes     Patient questions:  Do you have a fever, pain , or abdominal swelling? No. Pain Score  0 *  Have you tolerated food without any problems? Yes.    Have you been able to return to your normal activities? Yes.    Do you have any questions about your discharge instructions: Diet   No. Medications  No. Follow up visit  No.  Do you have questions or concerns about your Care? No.  Actions: * If pain score is 4 or above: No action needed, pain <4.

## 2023-09-17 LAB — SURGICAL PATHOLOGY

## 2023-09-18 ENCOUNTER — Encounter: Payer: Self-pay | Admitting: Internal Medicine

## 2023-11-24 ENCOUNTER — Ambulatory Visit: Payer: MEDICAID | Admitting: Family Medicine

## 2023-12-22 ENCOUNTER — Encounter: Payer: Self-pay | Admitting: Family Medicine

## 2023-12-22 ENCOUNTER — Telehealth: Payer: Self-pay

## 2023-12-22 ENCOUNTER — Ambulatory Visit: Payer: MEDICAID | Admitting: Family Medicine

## 2023-12-22 VITALS — BP 135/88 | HR 112 | Ht 64.0 in | Wt 152.4 lb

## 2023-12-22 DIAGNOSIS — L600 Ingrowing nail: Secondary | ICD-10-CM | POA: Diagnosis not present

## 2023-12-22 DIAGNOSIS — E1122 Type 2 diabetes mellitus with diabetic chronic kidney disease: Secondary | ICD-10-CM | POA: Diagnosis not present

## 2023-12-22 DIAGNOSIS — N182 Chronic kidney disease, stage 2 (mild): Secondary | ICD-10-CM

## 2023-12-22 LAB — POCT GLYCOSYLATED HEMOGLOBIN (HGB A1C): HbA1c, POC (controlled diabetic range): 9.4 % — AB (ref 0.0–7.0)

## 2023-12-22 MED ORDER — SEMAGLUTIDE(0.25 OR 0.5MG/DOS) 2 MG/1.5ML ~~LOC~~ SOPN: 0.2500 mg | PEN_INJECTOR | SUBCUTANEOUS | 3 refills | Status: DC

## 2023-12-22 NOTE — Assessment & Plan Note (Addendum)
 Poorly controlled.  A1c 9.4 today.  Patient has been faithful with her current oral regimen, however it is unclear how/if her diet may have changed recently.  Patient requires augmentation of regimen for improvement of A1c or she risks having to begin insulin therapy.  Diabetic foot exam completed today without concerns other than ingrown nail as below. Release form signed today so that we can try to obtain ophthalmology records. - Will start Ozempic 0.25 mg weekly - Continue metformin  1000 mg twice daily and Jardiance  25 mg daily - Continue with balanced diet (especially including protein with her carbs) and exercise -Return for follow-up in 4 to 6 weeks to assess Ozempic tolerance - Needs A1c recheck in 3 months

## 2023-12-22 NOTE — Patient Instructions (Signed)
 It was so good to see you today! Thank you for allowing me to take care of you.  Today we discussed the following concerns and plans:  Diabetes - Your A1c went up to 9.4 -we will add a new medicine to help get this under better control - Start taking Ozempic 0.5 mg once a week:  - Clean the area with an alcohol pad before injecting  - Press the pen to the skin and inject - Pick a new injection site on your belly each week so that you rotate the locations.  Do not inject in the same place every week. -Follow-up in 4 to 6 weeks to see how this medicine is doing for you - Follow-up in 3 months to recheck A1c  If you have any concerns, please call the clinic or schedule an appointment.  It was a pleasure to take care of you today. Be well!  Lauraine Norse, DO [facility], PGY-1   Don't forget to check out the [facility] in [facility] at [address] [phone] Affordable prices on prescriptions and over-the-counter items, as well as services like vaccinations and medication home delivery.

## 2023-12-22 NOTE — Telephone Encounter (Signed)
 Pharmacy Patient Advocate Encounter   Received notification from CoverMyMeds that prior authorization for OZEMPIC 0.25/0.5MG  is required/requested.   Insurance verification completed.   The patient is insured through [employer] .   PA required; PA submitted to above mentioned insurance via CoverMyMeds Key/confirmation #/EOC [employer]. Status is pending

## 2023-12-22 NOTE — Progress Notes (Signed)
    SUBJECTIVE:   CHIEF COMPLAINT / HPI:  Sister and ASL interpreter present to assist with visit.  T2DM She has been taking her metformin  1000 mg twice daily, Jardiance  25 mg daily.  Sister thinks she has been eating a balanced diet, however she is not totally sure what she has been eating when she is at her adult care program during the day.  Patient only drinks water, no sugary sodas or teas.  She does get regular physical activity. Due for diabetic foot exam today. Need records of ophthalmology exam.  PERTINENT  PMH / PSH: Reviewed.  OBJECTIVE:   BP 135/88   Pulse (!) 112   Ht 5' 4 (1.626 m)   Wt 152 lb 6.4 oz (69.1 kg)   SpO2 98%   BMI 26.16 kg/m    General: Well-appearing, no acute distress. HEENT: normocephalic, PERRLA, EOM grossly intact. Cardio: Regular rate, regular rhythm, no murmurs on exam. Pulm: Clear, no wheezing, no crackles. No increased work of breathing. Abdominal: bowel sounds present, soft, non-tender, non-distended. Extremities: Left great toe with in-curved nail, no erythema/swelling/drainage.  No peripheral edema. Moves all extremities equally. Psych:  Cognition and judgment appear intact. Alert, communicative, and cooperative.   ASSESSMENT/PLAN:   Assessment & Plan Type 2 diabetes mellitus with stage 2 chronic kidney disease, without long-term current use of insulin (HCC) Poorly controlled.  A1c 9.4 today.  Patient has been faithful with her current oral regimen, however it is unclear how/if her diet may have changed recently.  Patient requires augmentation of regimen for improvement of A1c or she risks having to begin insulin therapy.  Diabetic foot exam completed today without concerns other than ingrown nail as below. Release form signed today so that we can try to obtain ophthalmology records. - Will start Ozempic 0.25 mg weekly - Continue metformin  1000 mg twice daily and Jardiance  25 mg daily - Continue with balanced diet (especially including  protein with her carbs) and exercise -Return for follow-up in 4 to 6 weeks to assess Ozempic tolerance - Needs A1c recheck in 3 months Ingrown nail of great toe On the left great toe.  Nontender, no signs of infection. - Referral to podiatry for management per sister's wishes    Lauraine Norse, DO [facility]

## 2023-12-22 NOTE — Telephone Encounter (Signed)
 Pharmacy Patient Advocate Encounter  Received notification from [facility] that Prior Authorization for [facility] 0.25/0.5MG  has been APPROVED from 12/22/23 to 12/21/24

## 2024-01-02 ENCOUNTER — Ambulatory Visit (INDEPENDENT_AMBULATORY_CARE_PROVIDER_SITE_OTHER): Payer: MEDICAID | Admitting: Podiatry

## 2024-01-02 DIAGNOSIS — L600 Ingrowing nail: Secondary | ICD-10-CM | POA: Diagnosis not present

## 2024-01-02 DIAGNOSIS — M79671 Pain in right foot: Secondary | ICD-10-CM | POA: Diagnosis not present

## 2024-01-02 DIAGNOSIS — B351 Tinea unguium: Secondary | ICD-10-CM

## 2024-01-02 DIAGNOSIS — M79672 Pain in left foot: Secondary | ICD-10-CM | POA: Diagnosis not present

## 2024-01-02 NOTE — Progress Notes (Signed)
 Patient presents for evaluation and treatment of tenderness and some redness around nails feet.  Tenderness around toes with walking and wearing shoes.  Complains especially of pain on the hallux nail borders on the left.  Says gets red sometimes.  Has not noticed any infection or drainage today.  Physical exam:  General appearance: Alert, pleasant, and in no acute distress.  Vascular: Pedal pulses: DP 2/4 B/L, PT 2/4 B/L.  Mild edema lower legs bilaterally.  Capillary refill time immediate bilaterally  Neurological:  Normal Achilles tendon reflex.  Light touch intact bilaterally.  Dermatologic:  Nails thickened, disfigured, discolored 1-5 BL with subungual debris.  Redness and hypertrophic nail folds along nail folds bilaterally but no signs of drainage or infection.  Hallux nail on the left has sharply incurvated borders with hypertrophy of the nail folds and redness along the nail fold.  No break in the skin and no drainage noted.  Slight tenderness with pressure on the area.  Musculoskeletal:  Hammertoes 2 through 5 bilaterally good muscle strength at foot and ankle.  Normal range of motion subtalar joint ankle joint and first metatarsal phalangeal joint bilaterally   Diagnosis: 1. Painful onychomycotic nails 1 through 5 bilaterally. 2. Pain toes 1 through 5 bilaterally. 3.  Ingrown nail borders hallux nail left  Plan: -New patient office visit for evaluation and management level 3.  Modifier 25. - Discussed the ingrown nail on the hallux left.  Discussed that this continues to be a problem or gets infected will probably need to do matrixectomy or avulsion of the nail border.  Will just trim the nail border down today and see how she does with this.  -Debrided onychomycotic nails 1 through 5 bilaterally.  Return 3 months [facility]

## 2024-01-13 ENCOUNTER — Other Ambulatory Visit: Payer: Self-pay | Admitting: Family Medicine

## 2024-01-13 DIAGNOSIS — E1169 Type 2 diabetes mellitus with other specified complication: Secondary | ICD-10-CM

## 2024-01-13 DIAGNOSIS — F209 Schizophrenia, unspecified: Secondary | ICD-10-CM

## 2024-03-18 ENCOUNTER — Encounter: Payer: Self-pay | Admitting: Family Medicine

## 2024-03-29 ENCOUNTER — Ambulatory Visit: Payer: MEDICAID | Admitting: Podiatry

## 2024-05-18 ENCOUNTER — Ambulatory Visit: Payer: MEDICAID | Admitting: Family Medicine

## 2024-05-18 ENCOUNTER — Encounter: Payer: Self-pay | Admitting: Family Medicine

## 2024-05-18 VITALS — BP 127/83 | HR 103 | Wt 152.8 lb

## 2024-05-18 DIAGNOSIS — E1122 Type 2 diabetes mellitus with diabetic chronic kidney disease: Secondary | ICD-10-CM

## 2024-05-18 DIAGNOSIS — N182 Chronic kidney disease, stage 2 (mild): Secondary | ICD-10-CM

## 2024-05-18 LAB — POCT GLYCOSYLATED HEMOGLOBIN (HGB A1C): HbA1c, POC (controlled diabetic range): 7.8 % — AB (ref 0.0–7.0)

## 2024-05-18 MED ORDER — OZEMPIC (0.25 OR 0.5 MG/DOSE) 2 MG/3ML ~~LOC~~ SOPN: 0.5000 mg | PEN_INJECTOR | SUBCUTANEOUS | 1 refills | Status: DC

## 2024-05-18 NOTE — Assessment & Plan Note (Signed)
 A1c improved today and now in well-controlled range at 7.8. Appropriate to increase ozempic  at this time as she is not experiencing negative side effects. - increase ozempic  to 0.5mg  weekly - continue all other DM medications as prescribed - follow up in 3 months for repeat A1c - signed release form today to obtain ophthalmology records

## 2024-05-18 NOTE — Progress Notes (Signed)
    SUBJECTIVE:  Accompanied today by sister and in-person ASL interpreter.  CHIEF COMPLAINT / HPI:   T2DM Started ozempic  ~3 months ago. Initially with some diarrhea for 2 weeks but resolved now. Denies bloating, belly pain, nausea. Feels like diet/appetite is same.  PERTINENT  PMH / PSH: Reviewed.  OBJECTIVE:   BP 127/83   Pulse (!) 103   Wt 152 lb 12.8 oz (69.3 kg)   SpO2 99%   BMI 26.23 kg/m   General: well-appearing, no acute distress. HEENT: normocephalic, PERRLA, EOM grossly intact, MMM Cardio: Regular rate, regular rhythm, no murmurs on exam. Pulm: Clear, no wheezing, no crackles. No increased work of breathing. Abdominal: bowel sounds present, soft, non-tender, non-distended. Extremities: no peripheral edema. Moves all extremities equally. Neuro: Alert and oriented x3 Psych:  Cognition and judgment appear intact.   ASSESSMENT/PLAN:   Assessment & Plan Type 2 diabetes mellitus with stage 2 chronic kidney disease, without long-term current use of insulin (HCC) A1c improved today and now in well-controlled range at 7.8. Appropriate to increase ozempic  at this time as she is not experiencing negative side effects. - increase ozempic  to 0.5mg  weekly - continue all other DM medications as prescribed - follow up in 3 months for repeat A1c - signed release form today to obtain ophthalmology records   She is due for BMP for diabetic kidney function, however she does not want a blood draw. Declined today. Discussed importance of this at next visit.  Lauraine Norse, DO [facility]

## 2024-05-18 NOTE — Patient Instructions (Addendum)
 Increase your Ozempic  to 0.5mg  weekly. Keep taking all of your medications as prescribed.  Follow up in 3 months to check A1c and discuss increasing Ozempic  if needed.

## 2024-07-04 NOTE — Progress Notes (Unsigned)
" ° ° °  SUBJECTIVE:   CHIEF COMPLAINT / HPI:   T2DM Last A1c 7.8 in November 2025.  At that time increased Ozempic  to 0.5 mg weekly; she has tolerated this well and denies any stomach pain or upset.*** She is interested in increasing her Ozempic  again today. She has continued to take metformin  1000 mg twice daily, Jardiance  25 mg daily. Repeat A1c due in February.  Diabetic kidney evaluation (BMP) due today.    11/25: A1c improved today and now in well-controlled range at 7.8. Appropriate to increase ozempic  at this time as she is not experiencing negative side effects. - increase ozempic  to 0.5mg  weekly - continue all other DM medications as prescribed - follow up in 3 months for repeat A1c - signed release form today to obtain ophthalmology records   She is due for BMP for diabetic kidney function, however she does not want a blood draw. Declined today. Discussed importance of this at next visit.  PERTINENT  PMH / PSH: Reviewed.  OBJECTIVE:   There were no vitals taken for this visit.  General: ***-appearing, no acute distress. HEENT: normocephalic, PERRLA, EOM grossly intact, MMM, bilateral TM visualized without erythema or bulging. Cardio: Regular rate, *** rhythm, no murmurs on exam. Pulm: Clear, no wheezing, no crackles. No increased work of breathing. Abdominal: bowel sounds present, soft, non-tender, non-distended. No HSM. Extremities: no peripheral edema. Moves all extremities equally. Neuro: Alert and oriented x3, speech normal in content, no facial asymmetry, strength intact and equal bilaterally in UE and LE, pupils equal and reactive to light.  Psych:  Cognition and judgment appear intact. Alert, communicative, and cooperative with normal attention span and concentration. No apparent delusions, illusions, hallucinations    ASSESSMENT/PLAN:   Assessment & Plan Type 2 diabetes mellitus with stage 2 chronic kidney disease, without long-term current use of insulin  (HCC)      Lauraine Norse, DO [facility] "

## 2024-07-05 ENCOUNTER — Ambulatory Visit: Payer: Self-pay | Admitting: Family Medicine

## 2024-07-05 DIAGNOSIS — E1122 Type 2 diabetes mellitus with diabetic chronic kidney disease: Secondary | ICD-10-CM

## 2024-07-13 ENCOUNTER — Encounter: Payer: Self-pay | Admitting: Family Medicine

## 2024-07-13 ENCOUNTER — Ambulatory Visit: Payer: MEDICAID | Admitting: Family Medicine

## 2024-07-13 VITALS — BP 118/70 | HR 98 | Wt 152.0 lb

## 2024-07-13 DIAGNOSIS — E1122 Type 2 diabetes mellitus with diabetic chronic kidney disease: Secondary | ICD-10-CM | POA: Diagnosis not present

## 2024-07-13 DIAGNOSIS — E119 Type 2 diabetes mellitus without complications: Secondary | ICD-10-CM

## 2024-07-13 DIAGNOSIS — E785 Hyperlipidemia, unspecified: Secondary | ICD-10-CM | POA: Diagnosis not present

## 2024-07-13 DIAGNOSIS — Z23 Encounter for immunization: Secondary | ICD-10-CM

## 2024-07-13 DIAGNOSIS — N182 Chronic kidney disease, stage 2 (mild): Secondary | ICD-10-CM | POA: Diagnosis not present

## 2024-07-13 DIAGNOSIS — E1169 Type 2 diabetes mellitus with other specified complication: Secondary | ICD-10-CM

## 2024-07-13 MED ORDER — SEMAGLUTIDE (1 MG/DOSE) 4 MG/3ML ~~LOC~~ SOPN: 1.0000 mg | PEN_INJECTOR | SUBCUTANEOUS | 2 refills | Status: AC

## 2024-07-13 MED ORDER — EMPAGLIFLOZIN 25 MG PO TABS: 25.0000 mg | ORAL_TABLET | Freq: Every day | ORAL | 1 refills | Status: AC

## 2024-07-13 NOTE — Assessment & Plan Note (Addendum)
 Doing well.  Last lipid panel December 2023; consider repeat later this year. - Continue pravastatin  40 mg daily

## 2024-07-13 NOTE — Assessment & Plan Note (Addendum)
 She is doing well on Ozempic . - Increase Ozempic  dose to 1 mg weekly -Continue Jardiance  25 mg daily and metformin  1000 mg twice daily; refills sent as needed. - BMP today -Follow-up in 3 months to assess Ozempic  tolerance and to repeat A1c

## 2024-07-13 NOTE — Progress Notes (Signed)
" ° ° °  SUBJECTIVE:   CHIEF COMPLAINT / HPI:   T2DM Last A1c 7.8 in November 2025.  At that time increased Ozempic  to 0.5 mg weekly; she has tolerated this well and denies any stomach pain or upset. She is interested in increasing her Ozempic  again today. She has continued to take metformin  1000 mg twice daily, Jardiance  25 mg daily. Repeat A1c due in February.  Diabetic kidney evaluation (BMP) due today; declined at last visit.  HTN She has not been taking prescribed lisinopril  5 mg daily for some time now.   HLD She continues to take pravastatin  40 mg daily, no issues. No refills needed at this time.  PERTINENT  PMH / PSH: Reviewed.  OBJECTIVE:   BP 118/70   Pulse 98   Wt 152 lb (68.9 kg)   SpO2 98%   BMI 26.09 kg/m   General: well-appearing, no acute distress. HEENT: normocephalic, PERRLA, EOM grossly intact, MMM Cardio: Regular rate, regular rhythm, no murmurs on exam. Pulm: Clear, no wheezing, no crackles. No increased work of breathing. Abdominal: bowel sounds present, soft, non-tender, non-distended. No HSM. Extremities: No peripheral edema. Moves all extremities equally. Neuro: Alert and oriented x3, speech normal in content, no facial asymmetry Psych: Alert, communicative, and cooperative   ASSESSMENT/PLAN:   Assessment & Plan Type 2 diabetes mellitus with stage 2 chronic kidney disease, without long-term current use of insulin (HCC) She is doing well on Ozempic . - Increase Ozempic  dose to 1 mg weekly -Continue Jardiance  25 mg daily and metformin  1000 mg twice daily; refills sent as needed. - BMP today -Follow-up in 3 months to assess Ozempic  tolerance and to repeat A1c Hyperlipidemia associated with type 2 diabetes mellitus (HCC) Doing well.  Last lipid panel December 2023; consider repeat later this year. - Continue pravastatin  40 mg daily   COVID vaccine administered today.  Marissa Norse, DO [facility] "

## 2024-07-13 NOTE — Patient Instructions (Addendum)
 It was so good to see you today! Thank you for allowing me to take care of you.  Today we discussed the following concerns and plans:  Continue taking all of your medications as prescribed.  Diabetes - We will increase your Ozempic  dose today to 1 mg weekly.  I have sent in this new prescription to your pharmacy. - We will collect a BMP today to check your kidney function.  You are due for  - ophthalmology eye exam in March - mammogram in December  Follow up in 3 months to recheck your A1c.  If you have any concerns, please call the clinic or schedule an appointment.  It was a pleasure to take care of you today. Be well!  Lauraine Norse, DO [facility], PGY-2  Do you need your medications delivered to your home?   Well send your prescription to the [facility] for delivery.          Address: [address] [place], [place], [place] [address]          Phone: [phone]  Please call the [facility] to speak with a pharmacist and set up your home medication delivery. If you have any questions, feel free to contact us  -- were happy to help!  Other [facility] that offer affordable prices on both prescriptions and over-the-counter items, as well as convenient services like vaccinations, are  [facility], at [facility]         Address:  [address], [place], [place] [address]         Phone: [phone]  [facility], located in the [facility]        Address: [address] [place], [place] [address]        Phone: [phone]  [facility], at [facility]       Address: [address], [place], [place] [address]       Phone: [phone]  [facility], at [facility]       Address: [address], [place], [place] [address]       Phone: [phone]

## 2024-07-14 ENCOUNTER — Ambulatory Visit: Payer: Self-pay | Admitting: Family Medicine

## 2024-07-14 LAB — BASIC METABOLIC PANEL WITH GFR
BUN/Creatinine Ratio: 10 (ref 9–23)
BUN: 10 mg/dL (ref 6–24)
CO2: 20 mmol/L (ref 20–29)
Calcium: 9.6 mg/dL (ref 8.7–10.2)
Chloride: 101 mmol/L (ref 96–106)
Creatinine, Ser: 1.04 mg/dL — ABNORMAL HIGH (ref 0.57–1.00)
Glucose: 130 mg/dL — ABNORMAL HIGH (ref 70–99)
Potassium: 3.9 mmol/L (ref 3.5–5.2)
Sodium: 140 mmol/L (ref 134–144)
eGFR: 67 mL/min/1.73

## 2024-07-26 ENCOUNTER — Telehealth: Payer: Self-pay

## 2024-07-26 ENCOUNTER — Other Ambulatory Visit (HOSPITAL_COMMUNITY): Payer: Self-pay

## 2024-07-26 NOTE — Telephone Encounter (Signed)
 Pharmacy Patient Advocate Encounter   Received notification from [employer] KEY that prior authorization for JARDIANCE  25MG  is required/requested.   Insurance verification completed.   The patient is insured through [place] [employer]ICA[employer].   Per test claim: PA required; PA submitted to above mentioned insurance via Latent Key/confirmation #/EOC [id number]. Status is pending
